# Patient Record
Sex: Female | Born: 1995 | Race: Black or African American | Hispanic: No | Marital: Single | Smoking: Never smoker
Health system: Southern US, Community
[De-identification: ages and names within clinical notes are randomized; demographics above are authoritative.]

## PROBLEM LIST (undated history)

## (undated) ENCOUNTER — Ambulatory Visit: Source: Home / Self Care

## (undated) ENCOUNTER — Inpatient Hospital Stay: Payer: Self-pay

## (undated) DIAGNOSIS — K219 Gastro-esophageal reflux disease without esophagitis: Secondary | ICD-10-CM

## (undated) DIAGNOSIS — D509 Iron deficiency anemia, unspecified: Secondary | ICD-10-CM

## (undated) DIAGNOSIS — E119 Type 2 diabetes mellitus without complications: Secondary | ICD-10-CM

## (undated) DIAGNOSIS — R519 Headache, unspecified: Secondary | ICD-10-CM

## (undated) DIAGNOSIS — R51 Headache: Secondary | ICD-10-CM

## (undated) DIAGNOSIS — J45909 Unspecified asthma, uncomplicated: Secondary | ICD-10-CM

---
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
## 2014-04-02 ENCOUNTER — Emergency Department: Payer: Self-pay | Admitting: Emergency Medicine

## 2014-05-23 ENCOUNTER — Emergency Department: Payer: Self-pay | Admitting: Emergency Medicine

## 2014-05-23 LAB — URINALYSIS, COMPLETE
BILIRUBIN, UR: NEGATIVE
Bacteria: NONE SEEN
Blood: NEGATIVE
Glucose,UR: NEGATIVE mg/dL (ref 0–75)
Ketone: NEGATIVE
LEUKOCYTE ESTERASE: NEGATIVE
NITRITE: NEGATIVE
Ph: 5 (ref 4.5–8.0)
Protein: NEGATIVE
RBC,UR: <1 /HPF (ref 0–5)
Specific Gravity: 1.027 (ref 1.003–1.030)
WBC UR: 2 /HPF (ref 0–5)

## 2014-05-23 LAB — CBC WITH DIFFERENTIAL/PLATELET
BASOS ABS: 0 10*3/uL (ref 0.0–0.1)
BASOS PCT: 0.3 %
EOS ABS: 0 10*3/uL (ref 0.0–0.7)
Eosinophil %: 0.1 %
HCT: 42 % (ref 35.0–47.0)
HGB: 13.8 g/dL (ref 12.0–16.0)
LYMPHS ABS: 0.9 10*3/uL — AB (ref 1.0–3.6)
Lymphocyte %: 9.8 %
MCH: 29.2 pg (ref 26.0–34.0)
MCHC: 32.7 g/dL (ref 32.0–36.0)
MCV: 89 fL (ref 80–100)
Monocyte #: 0.5 x10 3/mm (ref 0.2–0.9)
Monocyte %: 5.5 %
NEUTROS ABS: 7.4 10*3/uL — AB (ref 1.4–6.5)
NEUTROS PCT: 84.3 %
PLATELETS: 341 10*3/uL (ref 150–440)
RBC: 4.71 10*6/uL (ref 3.80–5.20)
RDW: 14.3 % (ref 11.5–14.5)
WBC: 8.8 10*3/uL (ref 3.6–11.0)

## 2014-05-23 LAB — COMPREHENSIVE METABOLIC PANEL
ALBUMIN: 3.9 g/dL (ref 3.8–5.6)
ALT: 26 U/L
Alkaline Phosphatase: 60 U/L
Anion Gap: 8 (ref 7–16)
BUN: 6 mg/dL — AB (ref 7–18)
Bilirubin,Total: 0.7 mg/dL (ref 0.2–1.0)
CHLORIDE: 105 mmol/L (ref 98–107)
CO2: 26 mmol/L (ref 21–32)
CREATININE: 0.78 mg/dL (ref 0.60–1.30)
Calcium, Total: 8.7 mg/dL — ABNORMAL LOW (ref 9.0–10.7)
EGFR (African American): >60
EGFR (Non-African Amer.): >60
GLUCOSE: 113 mg/dL — AB (ref 65–99)
Osmolality: 276 (ref 275–301)
Potassium: 3.3 mmol/L — ABNORMAL LOW (ref 3.5–5.1)
SGOT(AST): 21 U/L (ref 0–26)
Sodium: 139 mmol/L (ref 136–145)
Total Protein: 8.3 g/dL (ref 6.4–8.6)

## 2014-05-23 LAB — LIPASE, BLOOD: LIPASE: 124 U/L (ref 73–393)

## 2014-08-01 ENCOUNTER — Emergency Department: Payer: Self-pay | Admitting: Emergency Medicine

## 2015-01-04 ENCOUNTER — Encounter (HOSPITAL_COMMUNITY): Payer: Self-pay | Admitting: Emergency Medicine

## 2015-01-04 ENCOUNTER — Emergency Department (HOSPITAL_COMMUNITY)
Admission: EM | Admit: 2015-01-04 | Discharge: 2015-01-04 | Disposition: A | Discharge status: Home / Self Care | Payer: Medicaid Other | Attending: Emergency Medicine | Admitting: Emergency Medicine

## 2015-01-04 DIAGNOSIS — Y92481 Parking lot as the place of occurrence of the external cause: Secondary | ICD-10-CM | POA: Insufficient documentation

## 2015-01-04 DIAGNOSIS — Y9389 Activity, other specified: Secondary | ICD-10-CM | POA: Insufficient documentation

## 2015-01-04 DIAGNOSIS — S40022A Contusion of left upper arm, initial encounter: Secondary | ICD-10-CM

## 2015-01-04 DIAGNOSIS — Y998 Other external cause status: Secondary | ICD-10-CM | POA: Insufficient documentation
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
## 2015-01-04 NOTE — Discharge Instructions (Signed)
Contusion °A contusion is a deep bruise. Contusions are the result of an injury that caused bleeding under the skin. The contusion may turn blue, purple, or yellow. Minor injuries will give you a painless contusion, but more severe contusions may stay painful and swollen for a few weeks.  °CAUSES  °A contusion is usually caused by a blow, trauma, or direct force to an area of the body. °SYMPTOMS  °· Swelling and redness of the injured area. °· Bruising of the injured area. °· Tenderness and soreness of the injured area. °· Pain. °DIAGNOSIS  °The diagnosis can be made by taking a history and physical exam. An X-ray, CT scan, or MRI may be needed to determine if there were any associated injuries, such as fractures. °TREATMENT  °Specific treatment will depend on what area of the body was injured. In general, the best treatment for a contusion is resting, icing, elevating, and applying cold compresses to the injured area. Over-the-counter medicines may also be recommended for pain control. Ask your caregiver what the best treatment is for your contusion. °HOME CARE INSTRUCTIONS  °· Put ice on the injured area. °¨ Put ice in a plastic bag. °¨ Place a towel between your skin and the bag. °¨ Leave the ice on for 15-20 minutes, 3-4 times a day, or as directed by your health care provider. °· Only take over-the-counter or prescription medicines for pain, discomfort, or fever as directed by your caregiver. Your caregiver may recommend avoiding anti-inflammatory medicines (aspirin, ibuprofen, and naproxen) for 48 hours because these medicines may increase bruising. °· Rest the injured area. °· If possible, elevate the injured area to reduce swelling. °SEEK IMMEDIATE MEDICAL CARE IF:  °· You have increased bruising or swelling. °· You have pain that is getting worse. °· Your swelling or pain is not relieved with medicines. °MAKE SURE YOU:  °· Understand these instructions. °· Will watch your condition. °· Will get help right  away if you are not doing well or get worse. °Document Released: 02/18/2005 Document Revised: 05/16/2013 Document Reviewed: 03/16/2011 °[employer]® Patient Information ©2015 [employer]. This information is not intended to replace advice given to you by your health care provider. Make sure you discuss any questions you have with your health care provider. ° °
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
## 2015-03-13 ENCOUNTER — Emergency Department
Admission: EM | Admit: 2015-03-13 | Discharge: 2015-03-13 | Disposition: A | Discharge status: Home / Self Care | Payer: Medicaid Other | Attending: Emergency Medicine | Admitting: Emergency Medicine

## 2015-03-13 ENCOUNTER — Emergency Department: Payer: Medicaid Other

## 2015-03-13 ENCOUNTER — Encounter: Payer: Self-pay | Admitting: Emergency Medicine

## 2015-03-13 DIAGNOSIS — O200 Threatened abortion: Secondary | ICD-10-CM | POA: Insufficient documentation

## 2015-03-13 DIAGNOSIS — O24111 Pre-existing diabetes mellitus, type 2, in pregnancy, first trimester: Secondary | ICD-10-CM | POA: Diagnosis not present

## 2015-03-13 DIAGNOSIS — R1030 Lower abdominal pain, unspecified: Secondary | ICD-10-CM | POA: Insufficient documentation

## 2015-03-13 DIAGNOSIS — E119 Type 2 diabetes mellitus without complications: Secondary | ICD-10-CM | POA: Insufficient documentation

## 2015-03-13 DIAGNOSIS — Z3490 Encounter for supervision of normal pregnancy, unspecified, unspecified trimester: Secondary | ICD-10-CM

## 2015-03-13 DIAGNOSIS — Z3A01 Less than 8 weeks gestation of pregnancy: Secondary | ICD-10-CM | POA: Diagnosis not present

## 2015-03-13 DIAGNOSIS — O9989 Other specified diseases and conditions complicating pregnancy, childbirth and the puerperium: Secondary | ICD-10-CM | POA: Diagnosis not present

## 2015-03-13 HISTORY — DX: Type 2 diabetes mellitus without complications: E11.9

## 2015-03-13 LAB — BASIC METABOLIC PANEL
ANION GAP: 9 (ref 5–15)
BUN: 6 mg/dL (ref 6–20)
CHLORIDE: 108 mmol/L (ref 101–111)
CO2: 23 mmol/L (ref 22–32)
CREATININE: 0.49 mg/dL (ref 0.44–1.00)
Calcium: 9 mg/dL (ref 8.9–10.3)
GFR calc non Af Amer: >60 mL/min (ref >60)
Glucose, Bld: 89 mg/dL (ref 65–99)
POTASSIUM: 3.6 mmol/L (ref 3.5–5.1)
Sodium: 140 mmol/L (ref 135–145)

## 2015-03-13 LAB — URINALYSIS COMPLETE WITH MICROSCOPIC (ARMC ONLY)
BACTERIA UA: NONE SEEN
Bilirubin Urine: NEGATIVE
Glucose, UA: NEGATIVE mg/dL
HGB URINE DIPSTICK: NEGATIVE
LEUKOCYTES UA: NEGATIVE
NITRITE: NEGATIVE
PH: 5 (ref 5.0–8.0)
PROTEIN: NEGATIVE mg/dL
SPECIFIC GRAVITY, URINE: 1.024 (ref 1.005–1.030)

## 2015-03-13 LAB — CBC WITH DIFFERENTIAL/PLATELET
BASOS PCT: 0 %
Basophils Absolute: 0 10*3/uL (ref 0–0.1)
Eosinophils Absolute: 0 10*3/uL (ref 0–0.7)
Eosinophils Relative: 0 %
HEMATOCRIT: 31.6 % — AB (ref 35.0–47.0)
HEMOGLOBIN: 10.3 g/dL — AB (ref 12.0–16.0)
Lymphocytes Relative: 26 %
Lymphs Abs: 2.3 10*3/uL (ref 1.0–3.6)
MCH: 27 pg (ref 26.0–34.0)
MCHC: 32.5 g/dL (ref 32.0–36.0)
MCV: 83.1 fL (ref 80.0–100.0)
MONOS PCT: 7 %
Monocytes Absolute: 0.6 10*3/uL (ref 0.2–0.9)
NEUTROS ABS: 5.8 10*3/uL (ref 1.4–6.5)
NEUTROS PCT: 67 %
Platelets: 368 10*3/uL (ref 150–440)
RBC: 3.81 MIL/uL (ref 3.80–5.20)
RDW: 15.7 % — ABNORMAL HIGH (ref 11.5–14.5)
WBC: 8.7 10*3/uL (ref 3.6–11.0)

## 2015-03-13 LAB — HCG, QUANTITATIVE, PREGNANCY: hCG, Beta Chain, Quant, S: 7105 m[IU]/mL — ABNORMAL HIGH (ref <5)

## 2015-03-13 LAB — POCT PREGNANCY, URINE: PREG TEST UR: POSITIVE — AB

## 2015-03-13 NOTE — Discharge Instructions (Signed)
Your ultrasound shows a 5 week 4 day chest station. Follow with GYN for further care. They will need to redraw your pregnancy hormone level and compared to today's level. If you have worsening pain, heavy vaginal bleeding, or other urgent concerns, return to the emergency department.  Threatened Miscarriage A threatened miscarriage is when you have vaginal bleeding during your first 20 weeks of pregnancy but the pregnancy has not ended. Your doctor will do tests to make sure you are still pregnant. The cause of the bleeding may not be known. This condition does not mean your pregnancy will end. It does increase the risk of it ending (complete miscarriage). HOME CARE   Make sure you keep all your doctor visits for prenatal care.  Get plenty of rest.  Do not have sex or use tampons if you have vaginal bleeding.  Do not douche.  Do not smoke or use drugs.  Do not drink alcohol.  Avoid caffeine. GET HELP IF:  You have light bleeding from your vagina.  You have belly pain or cramping.  You have a fever. GET HELP RIGHT AWAY IF:   You have heavy bleeding from your vagina.  You have clots of blood coming from your vagina.  You have bad pain or cramps in your low back or belly.  You have fever, chills, and bad belly pain. MAKE SURE YOU:   Understand these instructions.  Will watch your condition.  Will get help right away if you are not doing well or get worse.   This information is not intended to replace advice given to you by your health care provider. Make sure you discuss any questions you have with your health care provider.   Document Released: 04/23/2008 Document Revised: 05/16/2013 Document Reviewed: 03/07/2013 [employer] Interactive Patient Education [employer]2016 [employer]

## 2015-03-13 NOTE — ED Provider Notes (Signed)
[facility] Emergency Department Provider Note  ____________________________________________  Time seen: 8:35 AM  I have reviewed the triage vital signs and the nursing notes.   HISTORY  Chief Complaint Abdominal Pain     HPI Katherine Downs is a 19 y.o. female who presents the emergency department with discomfort in her lower abdomen. She reports she is 8 days late for the onset of her menstrual cycle. She feels like she is having some cramping. She is not having any vaginal bleeding. She has mild nausea. There is no diarrhea.    Past Medical History  Diagnosis Date  . Diabetes mellitus without complication (HCC)     There are no active problems to display for this patient.   History reviewed. No pertinent past surgical history.  No current outpatient prescriptions on file.  Allergies Ibuprofen  No family history on file.  Social History Social History  Substance Use Topics  . Smoking status: Never Smoker   . Smokeless tobacco: None  . Alcohol Use: No    Review of Systems  Constitutional: Negative for fever. ENT: Negative for sore throat. Cardiovascular: Negative for chest pain. Respiratory: Negative for cough. Gastrointestinal: Lower abdominal discomfort and cramping. See history of present illness. Genitourinary: Lower abdominal discomfort and cramping. 8 days late for her period. See history of present illness Musculoskeletal: No myalgias or injuries. Skin: Negative for rash. Neurological: Negative for headaches   10-point ROS otherwise negative.  ____________________________________________   PHYSICAL EXAM:  VITAL SIGNS: ED Triage Vitals  Enc Vitals Group     BP 03/13/15 0816 111/75 mmHg     Pulse Rate 03/13/15 0816 101     Resp 03/13/15 0816 18     Temp 03/13/15 0816 98.7 F (37.1 C)     Temp Source 03/13/15 0816 Oral     SpO2 03/13/15 0816 100 %     Weight 03/13/15 0816 220 lb (99.791 kg)     Height 03/13/15  0816 5\' 3"  (1.6 m)     Head Cir --      Peak Flow --      Pain Score 03/13/15 0817 6     Pain Loc --      Pain Edu? --      Excl. in GC? --     Constitutional:  Alert and oriented. Well appearing and in no distress. ENT   Head: Normocephalic and atraumatic.   Nose: No congestion/rhinnorhea.    Cardiovascular: Normal rate, regular rhythm, no murmur noted Respiratory:  Normal respiratory effort, no tachypnea.    Breath sounds are clear and equal bilaterally.  Gastrointestinal: Soft. No distention. Minimal tenderness in lower abdomen. Back: No muscle spasm, no tenderness, no CVA tenderness. Musculoskeletal: No deformity noted. Nontender with normal range of motion in all extremities.  No noted edema. Neurologic:  Normal speech and language. No gross focal neurologic deficits are appreciated.  Skin:  Skin is warm, dry. No rash noted. Psychiatric: Mood and affect are normal. Speech and behavior are normal.  ____________________________________________    LABS (pertinent positives/negatives)  Labs Reviewed  CBC WITH DIFFERENTIAL/PLATELET - Abnormal; Notable for the following:    Hemoglobin 10.3 (*)    HCT 31.6 (*)    RDW 15.7 (*)    All other components within normal limits  URINALYSIS COMPLETEWITH MICROSCOPIC ([facility] ONLY) - Abnormal; Notable for the following:    Color, Urine YELLOW (*)    APPearance HAZY (*)    Ketones, ur 1+ (*)  Squamous Epithelial / LPF 6-30 (*)    All other components within normal limits  POCT PREGNANCY, URINE - Abnormal; Notable for the following:    Preg Test, Ur POSITIVE (*)    All other components within normal limits  BASIC METABOLIC PANEL  HCG, QUANTITATIVE, PREGNANCY  POC URINE PREG, ED     ____________________________________________  RADIOLOGY  US Ob. IMPRESSION: 1. Intrauterine gestational sac estimated at 5 weeks and 4 days gestation. A yolk sac is present. No embryonic pole. Recommend follow-up serial quantitative beta HCG  levels and repeat ultrasound in 10-14 days. 2. Corpus luteum cyst right ovary.  ____________________________________________   PROCEDURES   ____________________________________________   INITIAL IMPRESSION / ASSESSMENT AND PLAN / ED COURSE  Pertinent labs & imaging results that were available during my care of the patient were reviewed by me and considered in my medical decision making (see chart for details).  Well-appearing 39 oh female, 8 days late for her menstrual period. She has not done a pregnancy test at home and is wondering if she might be pregnant. We will get a pregnancy test, urinalysis, CBC and metabolic panel.  ----------------------------------------- 9:15 AM on 03/13/2015 -----------------------------------------  Pregnancy test is positive. We will add on hCG and an ultrasound.  ----------------------------------------- 1:12 PM on 03/13/2015 -----------------------------------------  Ultrasound shows an IUP at 5 weeks 4 days. HCG level is still pending. I've had called the lab about this value. They report that they're now running the test currently. The level of hCG today will not change her treatment plan. She will need to follow-up with GYN for repeat hCG testing in 2 days. I explained this to the patient and her mother. The GYN that repeats the test will be able to review the hCG level from today.  I will discharge the patient home with precautions for possible miscarriage.   ____________________________________________   FINAL CLINICAL IMPRESSION(S) / ED DIAGNOSES  Final diagnoses:  Early stage of pregnancy  Threatened miscarriage in early pregnancy      Darien Ramus, MD 03/13/15 1320

## 2015-03-13 NOTE — ED Notes (Signed)
Pt states abdominal pain/cramping started 4 days ago. Also states about 8 days late starting menstrual cycle. Denies n/v.

## 2015-03-13 NOTE — ED Notes (Signed)
Pt states lower abd pain began Sunday, states she could possibly be pregnant, she is 8 days late on cycle. Nausea only, no V/D.

## 2015-05-29 ENCOUNTER — Encounter: Payer: Self-pay | Admitting: *Deleted

## 2015-05-29 ENCOUNTER — Emergency Department
Admission: EM | Admit: 2015-05-29 | Discharge: 2015-05-29 | Disposition: A | Discharge status: Home / Self Care | Payer: Medicaid Other | Attending: Emergency Medicine | Admitting: Emergency Medicine

## 2015-05-29 DIAGNOSIS — O24112 Pre-existing diabetes mellitus, type 2, in pregnancy, second trimester: Secondary | ICD-10-CM | POA: Insufficient documentation

## 2015-05-29 DIAGNOSIS — Z3A16 16 weeks gestation of pregnancy: Secondary | ICD-10-CM | POA: Diagnosis not present

## 2015-05-29 DIAGNOSIS — Z3490 Encounter for supervision of normal pregnancy, unspecified, unspecified trimester: Secondary | ICD-10-CM

## 2015-05-29 DIAGNOSIS — O9989 Other specified diseases and conditions complicating pregnancy, childbirth and the puerperium: Secondary | ICD-10-CM | POA: Diagnosis present

## 2015-05-29 DIAGNOSIS — R1030 Lower abdominal pain, unspecified: Secondary | ICD-10-CM | POA: Diagnosis not present

## 2015-05-29 LAB — URINALYSIS COMPLETE WITH MICROSCOPIC (ARMC ONLY)
Bilirubin Urine: NEGATIVE
Glucose, UA: NEGATIVE mg/dL
Hgb urine dipstick: NEGATIVE
Ketones, ur: NEGATIVE mg/dL
Leukocytes, UA: NEGATIVE
Nitrite: NEGATIVE
PH: 6 (ref 5.0–8.0)
PROTEIN: NEGATIVE mg/dL
SPECIFIC GRAVITY, URINE: 1.023 (ref 1.005–1.030)

## 2015-05-29 LAB — COMPREHENSIVE METABOLIC PANEL
ALT: 13 U/L — ABNORMAL LOW (ref 14–54)
AST: 13 U/L — ABNORMAL LOW (ref 15–41)
Albumin: 3.6 g/dL (ref 3.5–5.0)
Alkaline Phosphatase: 50 U/L (ref 38–126)
Anion gap: 7 (ref 5–15)
BUN: 5 mg/dL — ABNORMAL LOW (ref 6–20)
CO2: 23 mmol/L (ref 22–32)
Calcium: 9.2 mg/dL (ref 8.9–10.3)
Chloride: 107 mmol/L (ref 101–111)
Creatinine, Ser: 0.35 mg/dL — ABNORMAL LOW (ref 0.44–1.00)
GFR calc Af Amer: >60 mL/min (ref >60)
GFR calc non Af Amer: >60 mL/min (ref >60)
Glucose, Bld: 102 mg/dL — ABNORMAL HIGH (ref 65–99)
Potassium: 3.3 mmol/L — ABNORMAL LOW (ref 3.5–5.1)
Sodium: 137 mmol/L (ref 135–145)
Total Bilirubin: 0.5 mg/dL (ref 0.3–1.2)
Total Protein: 7.4 g/dL (ref 6.5–8.1)

## 2015-05-29 LAB — CBC
HEMATOCRIT: 34.4 % — AB (ref 35.0–47.0)
HEMOGLOBIN: 11.7 g/dL — AB (ref 12.0–16.0)
MCH: 28.3 pg (ref 26.0–34.0)
MCHC: 34 g/dL (ref 32.0–36.0)
MCV: 83.2 fL (ref 80.0–100.0)
Platelets: 355 10*3/uL (ref 150–440)
RBC: 4.13 MIL/uL (ref 3.80–5.20)
RDW: 17.2 % — ABNORMAL HIGH (ref 11.5–14.5)
WBC: 10.5 10*3/uL (ref 3.6–11.0)

## 2015-05-29 LAB — HCG, QUANTITATIVE, PREGNANCY: hCG, Beta Chain, Quant, S: 23357 m[IU]/mL — ABNORMAL HIGH (ref <5)

## 2015-05-29 LAB — POCT PREGNANCY, URINE: PREG TEST UR: POSITIVE — AB

## 2015-05-29 NOTE — ED Notes (Signed)
Fetal heart tones approx 142 with doppler. Hard to count due to location.

## 2015-05-29 NOTE — ED Notes (Signed)
Pt states lower abd cramping, states she is 16 weeks pregnet, denies any bleeding

## 2015-05-29 NOTE — Discharge Instructions (Signed)
You have appropriate fetal heart tones today. He had an ultrasound previously that showed an IUP. Please call your obstetricians at [facility] for follow-up for this discomfort you been having. Return to the emergency department if he began to have bleeding, worsening pain, or other urgent concerns.

## 2015-05-29 NOTE — ED Provider Notes (Signed)
[facility] Emergency Department Provider Note  ____________________________________________  Time seen: 1920  I have reviewed the triage vital signs and the nursing notes.  History by:  Patient  HISTORY  Chief Complaint Abdominal Pain  [redacted] weeks pregnant    HPI Katherine Downs is a 20 y.o. female who reports that she has been having lower abdominal discomfort for the past 3 days.She is [redacted] weeks pregnant. [facility]. She had an ultrasound in November which confirmed an IUP at that time. She denies any vaginal bleeding. She reports this discomfort is diffuse and equal across the lower abdomen and is bothering her at night and not letting her sleep well. She denies any nausea or vomiting.     Past Medical History  Diagnosis Date  . Diabetes mellitus without complication (HCC)     There are no active problems to display for this patient.   History reviewed. No pertinent past surgical history.  No current outpatient prescriptions on file.  Allergies Ibuprofen  History reviewed. No pertinent family history.  Social History Social History  Substance Use Topics  . Smoking status: Never Smoker   . Smokeless tobacco: None  . Alcohol Use: No    Review of Systems  Constitutional: Negative for fever/chills. ENT: Negative for congestion. Cardiovascular: Negative for chest pain. Respiratory: Negative for cough. Gastrointestinal: Negative for vomiting and diarrhea. Genitourinary: Pregnant, lower abdominal pain, see history of present illness Musculoskeletal: No back pain. Skin: Negative for rash. Neurological: Negative for headache or focal weakness   10-point ROS otherwise negative.  ____________________________________________   PHYSICAL EXAM:  VITAL SIGNS: ED Triage Vitals  Enc Vitals Group     BP 05/29/15 1653 149/86 mmHg     Pulse Rate 05/29/15 1653 91     Resp 05/29/15 1653 18     Temp 05/29/15 1653 98.3  F (36.8 C)     Temp Source 05/29/15 1653 Oral     SpO2 05/29/15 1653 99 %     Weight 05/29/15 1653 220 lb (99.791 kg)     Height 05/29/15 1653 5\' 4"  (1.626 m)     Head Cir --      Peak Flow --      Pain Score 05/29/15 1654 8     Pain Loc --      Pain Edu? --      Excl. in GC? --     Constitutional: Alert and oriented. Well appearing and in no distress. ENT   Head: Normocephalic and atraumatic. Cardiovascular: Normal rate, regular rhythm, no murmur noted Respiratory:  Normal respiratory effort, no tachypnea.    Breath sounds are clear and equal bilaterally.  Gastrointestinal: Soft, no distention. Mild tenderness throughout the lower abdomen. Back: No muscle spasm, no tenderness, no CVA tenderness. Musculoskeletal: No deformity noted. Nontender with normal range of motion in all extremities.  No noted edema. Neurologic:  Communicative. Normal appearing spontaneous movement in all 4 extremities. No gross focal neurologic deficits are appreciated.  Skin:  Skin is warm, dry. No rash noted. Psychiatric: Mood and affect are normal. Speech and behavior are normal.  ____________________________________________    LABS (pertinent positives/negatives)  Labs Reviewed  COMPREHENSIVE METABOLIC PANEL - Abnormal; Notable for the following:    Potassium 3.3 (*)    Glucose, Bld 102 (*)    BUN 5 (*)    Creatinine, Ser 0.35 (*)    AST 13 (*)    ALT 13 (*)    All  other components within normal limits  CBC - Abnormal; Notable for the following:    Hemoglobin 11.7 (*)    HCT 34.4 (*)    RDW 17.2 (*)    All other components within normal limits  URINALYSIS COMPLETEWITH MICROSCOPIC ([facility] ONLY) - Abnormal; Notable for the following:    Color, Urine YELLOW (*)    APPearance CLEAR (*)    Bacteria, UA FEW (*)    Squamous Epithelial / LPF 6-30 (*)    All other components within normal limits  POCT PREGNANCY, URINE - Abnormal; Notable for the following:    Preg Test, Ur POSITIVE (*)    All  other components within normal limits  HCG, QUANTITATIVE, PREGNANCY     ____________________________________________   EKG    ____________________________________________    RADIOLOGY    ____________________________________________   PROCEDURES    ____________________________________________   INITIAL IMPRESSION / ASSESSMENT AND PLAN / ED COURSE  Pertinent labs & imaging results that were available during my care of the patient were reviewed by me and considered in my medical decision making (see chart for details).  Well-appearing 35 oh female in no acute distress. She is [redacted] weeks pregnant and has lower abdominal pain. She has mild diffuse tenderness throughout the lower abdomen. She denies any bleeding or discharge. She does have positive fetal heart tones documented by the nursing staff. She has a prior ultrasound that shows an IUP. There is no indication that this is a miscarriage. I discussed how this could be discomfort of pregnancy. I've asked her to follow-up with her obstetrician's at [facility].  ____________________________________________   FINAL CLINICAL IMPRESSION(S) / ED DIAGNOSES  Final diagnoses:  Pregnancy  Lower abdominal pain   discomfort of pregnancy    Katherine Ramus, MD 05/29/15 1935

## 2015-07-26 DIAGNOSIS — IMO0001 Reserved for inherently not codable concepts without codable children: Secondary | ICD-10-CM | POA: Insufficient documentation

## 2015-07-26 DIAGNOSIS — R030 Elevated blood-pressure reading, without diagnosis of hypertension: Secondary | ICD-10-CM

## 2015-08-04 ENCOUNTER — Emergency Department: Payer: Medicaid Other

## 2015-08-04 ENCOUNTER — Encounter: Payer: Self-pay | Admitting: Radiology

## 2015-08-04 ENCOUNTER — Emergency Department
Admission: EM | Admit: 2015-08-04 | Discharge: 2015-08-04 | Disposition: A | Discharge status: Home / Self Care | Payer: Medicaid Other | Attending: Emergency Medicine | Admitting: Emergency Medicine

## 2015-08-04 DIAGNOSIS — O26892 Other specified pregnancy related conditions, second trimester: Secondary | ICD-10-CM | POA: Diagnosis present

## 2015-08-04 DIAGNOSIS — E876 Hypokalemia: Secondary | ICD-10-CM | POA: Diagnosis not present

## 2015-08-04 DIAGNOSIS — Z3A26 26 weeks gestation of pregnancy: Secondary | ICD-10-CM | POA: Insufficient documentation

## 2015-08-04 DIAGNOSIS — B349 Viral infection, unspecified: Secondary | ICD-10-CM | POA: Insufficient documentation

## 2015-08-04 DIAGNOSIS — E119 Type 2 diabetes mellitus without complications: Secondary | ICD-10-CM | POA: Diagnosis not present

## 2015-08-04 LAB — URINALYSIS COMPLETE WITH MICROSCOPIC (ARMC ONLY)
Bilirubin Urine: NEGATIVE
GLUCOSE, UA: NEGATIVE mg/dL
HGB URINE DIPSTICK: NEGATIVE
Ketones, ur: NEGATIVE mg/dL
Leukocytes, UA: NEGATIVE
NITRITE: NEGATIVE
PROTEIN: 30 mg/dL — AB
SPECIFIC GRAVITY, URINE: 1.028 (ref 1.005–1.030)
pH: 6 (ref 5.0–8.0)

## 2015-08-04 LAB — CBC
HEMATOCRIT: 31.9 % — AB (ref 35.0–47.0)
HEMOGLOBIN: 10.8 g/dL — AB (ref 12.0–16.0)
MCH: 29.1 pg (ref 26.0–34.0)
MCHC: 33.8 g/dL (ref 32.0–36.0)
MCV: 86.2 fL (ref 80.0–100.0)
Platelets: 382 10*3/uL (ref 150–440)
RBC: 3.7 MIL/uL — ABNORMAL LOW (ref 3.80–5.20)
RDW: 15 % — AB (ref 11.5–14.5)
WBC: 10.3 10*3/uL (ref 3.6–11.0)

## 2015-08-04 LAB — BASIC METABOLIC PANEL
ANION GAP: 8 (ref 5–15)
CHLORIDE: 106 mmol/L (ref 101–111)
CO2: 20 mmol/L — AB (ref 22–32)
Calcium: 8.4 mg/dL — ABNORMAL LOW (ref 8.9–10.3)
Creatinine, Ser: 0.38 mg/dL — ABNORMAL LOW (ref 0.44–1.00)
GFR calc Af Amer: >60 mL/min (ref >60)
GLUCOSE: 91 mg/dL (ref 65–99)
POTASSIUM: 2.8 mmol/L — AB (ref 3.5–5.1)
Sodium: 134 mmol/L — ABNORMAL LOW (ref 135–145)

## 2015-08-04 LAB — INFLUENZA PANEL BY PCR (TYPE A & B)
H1N1 flu by pcr: NOT DETECTED
INFLBPCR: NEGATIVE
Influenza A By PCR: NEGATIVE

## 2015-08-04 LAB — RAPID INFLUENZA A&B ANTIGENS (ARMC ONLY)
INFLUENZA A (ARMC): NEGATIVE
INFLUENZA B (ARMC): NEGATIVE

## 2015-08-04 MED ORDER — ACETAMINOPHEN 325 MG PO TABS
650.0000 mg | ORAL_TABLET | Freq: Once | ORAL | Status: AC
  Administered 2015-08-04: 650 mg via ORAL

## 2015-08-04 MED ORDER — ACETAMINOPHEN 325 MG PO TABS
ORAL_TABLET | ORAL | Status: DC
Start: 2015-08-04 — End: 2015-08-05
  Filled 2015-08-04: qty 2

## 2015-08-04 MED ORDER — POTASSIUM CHLORIDE CRYS ER 20 MEQ PO TBCR
40.0000 meq | EXTENDED_RELEASE_TABLET | Freq: Once | ORAL | Status: AC
  Administered 2015-08-04: 40 meq via ORAL
  Filled 2015-08-04: qty 2

## 2015-08-04 MED ORDER — SODIUM CHLORIDE 0.9 % IV BOLUS (SEPSIS)
1000.0000 mL | Freq: Once | INTRAVENOUS | Status: AC
  Administered 2015-08-04: 1000 mL via INTRAVENOUS

## 2015-08-04 MED ORDER — POTASSIUM CHLORIDE ER 10 MEQ PO TBCR: 20.0000 meq | EXTENDED_RELEASE_TABLET | Freq: Every day | ORAL | Status: DC

## 2015-08-04 NOTE — ED Notes (Addendum)
Pt states that she woke up this am with headache, hot, sweating, painful cough, feeling weak and dizzy at times, reports some diarrhea. Took a tylenol at 1000. Pt reports that she is 6mos pregnant

## 2015-08-04 NOTE — Discharge Instructions (Signed)
You have been seen in the Emergency Department (ED) today for a likely viral illness.  Please drink plenty of clear fluids (water, Gatorade, chicken broth, etc).  You may use Tylenol according to label instructions.  Call [facility] in the morning for a follow-up appointment within the next 1 to 2 days.  Please follow up with your doctor as listed above.  Call your doctor or return to the Emergency Department (ED) if you are unable to tolerate fluids due to vomiting, have worsening trouble breathing, have ANY abdominal pain or vaginal bleeding or discharge, become extremely tired or difficult to awaken, or if you develop any other symptoms that concern you.   Viral Infections A viral infection can be caused by different types of viruses.Most viral infections are not serious and resolve on their own. However, some infections may cause severe symptoms and may lead to further complications. SYMPTOMS Viruses can frequently cause:  Minor sore throat.  Aches and pains.  Headaches.  Runny nose.  Different types of rashes.  Watery eyes.  Tiredness.  Cough.  Loss of appetite.  Gastrointestinal infections, resulting in nausea, vomiting, and diarrhea. These symptoms do not respond to antibiotics because the infection is not caused by bacteria. However, you might catch a bacterial infection following the viral infection. This is sometimes called a "superinfection." Symptoms of such a bacterial infection may include:  Worsening sore throat with pus and difficulty swallowing.  Swollen neck glands.  Chills and a high or persistent fever.  Severe headache.  Tenderness over the sinuses.  Persistent overall ill feeling (malaise), muscle aches, and tiredness (fatigue).  Persistent cough.  Yellow, green, or brown mucus production with coughing. HOME CARE INSTRUCTIONS   Only take over-the-counter or prescription medicines for pain, discomfort, diarrhea, or fever as directed by  your caregiver.  Drink enough water and fluids to keep your urine clear or pale yellow. Sports drinks can provide valuable electrolytes, sugars, and hydration.  Get plenty of rest and maintain proper nutrition. Soups and broths with crackers or rice are fine. SEEK IMMEDIATE MEDICAL CARE IF:   You have severe headaches, shortness of breath, chest pain, neck pain, or an unusual rash.  You have uncontrolled vomiting, diarrhea, or you are unable to keep down fluids.  You or your child has an oral temperature above 102 F (38.9 C), not controlled by medicine.  Your baby is older than 3 months with a rectal temperature of 102 F (38.9 C) or higher.  Your baby is 443 months old or younger with a rectal temperature of 100.4 F (38 C) or higher. MAKE SURE YOU:   Understand these instructions.  Will watch your condition.  Will get help right away if you are not doing well or get worse.   This information is not intended to replace advice given to you by your health care provider. Make sure you discuss any questions you have with your health care provider.   Document Released: 02/18/2005 Document Revised: 08/03/2011 Document Reviewed: 10/17/2014 Elsevier Interactive Patient Education [employer]2016 [employer]vier Inc.

## 2015-08-04 NOTE — ED Notes (Signed)
Lab called with potassium of 2.8   Dr Fanny Bienquale aware.

## 2015-08-04 NOTE — ED Notes (Signed)
Fetal HR 150

## 2015-08-05 NOTE — ED Provider Notes (Signed)
[facility] Emergency Department Provider Note  ____________________________________________  Time seen: Approximately 20:00   I have reviewed the triage vital signs and the nursing notes.   HISTORY  Chief Complaint Weakness    HPI Katherine Downs is a 20 y.o. female reports that she is approximately [redacted] weeks pregnant. She states that yesterday she began feeling a little chilled and achy. This 40 she woke up with a mild headache, feeling hot and sweaty, she's having a cough and a dry throat. She reports achy muscles and she's had approximately 4 loose watery nonbloody stools today.  She reports she continues to feel the baby move normally. She is not having abdominal pain vaginal discharge or loss of fluid. She does report a dry nonproductive cough. She is able to swallow but just feels a scratchy and somewhat painful sensation in the back of her throat swallowing.   Past Medical History  Diagnosis Date  . Diabetes mellitus without complication (HCC)     There are no active problems to display for this patient.   No past surgical history on file.  Current Outpatient Rx  Name  Route  Sig  Dispense  Refill  . potassium chloride (K-DUR) 10 MEQ tablet   Oral   Take 2 tablets (20 mEq total) by mouth daily.   8 tablet   0     Allergies Ibuprofen  No family history on file.  Social History Social History  Substance Use Topics  . Smoking status: Never Smoker   . Smokeless tobacco: Not on file  . Alcohol Use: No    Review of Systems Constitutional: Low-grade fever and some chills and muscle aches. Feels fatigued. Eyes: No visual changes. ENT: Slight sore scratchy throat. No trouble swallowing. Cardiovascular: Denies chest pain. Respiratory: Denies shortness of breath. Does report a dry nonproductive cough. Gastrointestinal: No abdominal pain.  No nausea, no vomiting.  Few loose stools today. Nonbloody. Denies recent travel, no recent  antibiotics. Genitourinary: Negative for dysuria. Musculoskeletal: Negative for back pain. Skin: Negative for rash. Neurological: Negative for focal weakness or numbness. No neck pain or stiffness. No confusion.  10-point ROS otherwise negative.  ____________________________________________   PHYSICAL EXAM:  VITAL SIGNS: ED Triage Vitals  Enc Vitals Group     BP 08/04/15 1851 122/73 mmHg     Pulse Rate 08/04/15 1851 120     Resp 08/04/15 1851 18     Temp 08/04/15 1851 100.1 F (37.8 C)     Temp Source 08/04/15 1851 Oral     SpO2 08/04/15 1851 98 %     Weight 08/04/15 1851 231 lb (104.781 kg)     Height 08/04/15 1851  (1.626 m)     Head Cir --      Peak Flow --      Pain Score 08/04/15 2026 6     Pain Loc --      Pain Edu? --      Excl. in GC? --    Constitutional: Alert and oriented. Well appearing and in no acute distress.Very pleasant. Sitting upright watching television Eyes: Conjunctivae are normal. PERRL. EOMI. Head: Atraumatic. Nose: No congestion/rhinnorhea. No tonsillar hypertrophy or exudates. Mild injection of the posterior pharynx with no lesion or mass. No anterior tender cervical adenopathy. No meningismus, and negative for jolt accentuation of her mild headache. Mouth/Throat: Mucous membranes are moist.  Neck: No stridor.   Cardiovascular: Slightly tachycardic rate, regular rhythm. Grossly normal heart sounds.  Good peripheral circulation.  Respiratory: Normal respiratory effort.  No retractions. Lungs CTAB. Occasional dry cough. No wheezing. Speaks in full and complete sentences. Gastrointestinal: Soft and nontender. No distention. Gravid. Musculoskeletal: No lower extremity tenderness nor edema.  No joint effusions. Neurologic:  Normal speech and language. No gross focal neurologic deficits are appreciated. No photophobia. No gait instability. Skin:  Skin is warm, dry and intact. No rash noted. Psychiatric: Mood and affect are normal. Speech and behavior  are normal.  ____________________________________________   LABS (all labs ordered are listed, but only abnormal results are displayed)  Labs Reviewed  BASIC METABOLIC PANEL - Abnormal; Notable for the following:    Sodium 134 (*)    Potassium 2.8 (*)    CO2 20 (*)    BUN <5 (*)    Creatinine, Ser 0.38 (*)    Calcium 8.4 (*)    All other components within normal limits  CBC - Abnormal; Notable for the following:    RBC 3.70 (*)    Hemoglobin 10.8 (*)    HCT 31.9 (*)    RDW 15.0 (*)    All other components within normal limits  URINALYSIS COMPLETEWITH MICROSCOPIC ([facility] ONLY) - Abnormal; Notable for the following:    Color, Urine AMBER (*)    APPearance CLEAR (*)    Protein, ur 30 (*)    Bacteria, UA RARE (*)    Squamous Epithelial / LPF 0-5 (*)    All other components within normal limits  RAPID INFLUENZA A&B ANTIGENS ([facility] ONLY)  INFLUENZA PANEL BY PCR (TYPE A & B, H1N1)   ____________________________________________  EKG  Reviewed and interpreted by me at 2050 Sinus tachycardia at a rate of 105 QRS normal QTC normal No evidence of acute ischemic abnormality. Reviewed and interpreted as sinus tachycardia ____________________________________________  RADIOLOGY  DG Chest 2 View (Final result) Result time: 08/04/15 16:10:96   Final result by Rad Results In Interface (08/04/15 04:54:09)   Narrative:   CLINICAL DATA: Acute onset of cough, generalized chest pain and weakness. Syncope. Initial encounter.  EXAM: CHEST 2 VIEW  COMPARISON: Chest radiograph performed 01/21/2015  FINDINGS: The lungs are well-aerated and clear. There is no evidence of focal opacification, pleural effusion or pneumothorax.  The heart is normal in size; the mediastinal contour is within normal limits. No acute osseous abnormalities are seen.  IMPRESSION: No acute cardiopulmonary process seen.   Electronically Signed By: Roanna Raider M.D. On: 08/04/2015 21:26     ____________________________________________   PROCEDURES  Procedure(s) performed: None  Critical Care performed: No  ____________________________________________   INITIAL IMPRESSION / ASSESSMENT AND PLAN / ED COURSE  Pertinent labs & imaging results that were available during my care of the patient were reviewed by me and considered in my medical decision making (see chart for details).  Patient presents for fever, mild headache, cough and sore throat. Her influenza test including rapid and more sensitive PCR are both negative. She does not have any photophobia, meningismus, negative jolt accentuation confusion or clinical examination/history consistent with any concern for meningitis. Her chest x-ray is clear, her labs are reassuring except her potassium is slightly low. In the setting of recent loose stools, I suspect is likely due to GI loss. Her EKG is reassuring. Overall her clinical examination appears to be most consistent with viral illness. No evidence of acute bacterial illness by exam. Centor score = 1, low risk and no evidence to suggest the need for testing for acute strep at this time, her clinical history and exam  do not seem to suggest risk for strep infection.  Denies any gynecologic/obstetric complaint. Fetal heart tones 150. Patient awake alert no distress. She does feel much better after hydration. No ketones in the urine.  Case and care discussed with Dr. Arvella MerlesSchemerhorn, of on-call gynecologist. He advises patient would agree patient appropriate for discharge with potassium repletion prescription. I believe that she is nontoxic in no distress and likely viral illness.  Discussed with the patient, and her mother and gave strict return precautions as well as prescription for potassium. [facility] tomorrow to arrange close follow-up.  Return precautions and treatment recommendations and follow-up discussed with the patient who  is agreeable with the plan.  ____________________________________________   FINAL CLINICAL IMPRESSION(S) / ED DIAGNOSES  Final diagnoses:  Viral syndrome  Hypokalemia      Sharyn CreamerMark Jesstin Studstill, MD 08/05/15 0013

## 2015-09-02 DIAGNOSIS — R4586 Emotional lability: Secondary | ICD-10-CM | POA: Insufficient documentation

## 2015-09-10 DIAGNOSIS — O99810 Abnormal glucose complicating pregnancy: Secondary | ICD-10-CM | POA: Insufficient documentation

## 2015-09-16 ENCOUNTER — Observation Stay
Admission: EM | Admit: 2015-09-16 | Discharge: 2015-09-17 | Disposition: A | Discharge status: Home / Self Care | Payer: No Typology Code available for payment source | Attending: Obstetrics and Gynecology | Admitting: Obstetrics and Gynecology

## 2015-09-16 ENCOUNTER — Encounter: Payer: Self-pay | Admitting: Emergency Medicine

## 2015-09-16 DIAGNOSIS — Z6740 Type O blood, Rh positive: Secondary | ICD-10-CM | POA: Diagnosis not present

## 2015-09-16 DIAGNOSIS — O4703 False labor before 37 completed weeks of gestation, third trimester: Secondary | ICD-10-CM | POA: Insufficient documentation

## 2015-09-16 DIAGNOSIS — Z3A32 32 weeks gestation of pregnancy: Secondary | ICD-10-CM | POA: Diagnosis not present

## 2015-09-16 DIAGNOSIS — Y9389 Activity, other specified: Secondary | ICD-10-CM | POA: Diagnosis not present

## 2015-09-16 DIAGNOSIS — Y998 Other external cause status: Secondary | ICD-10-CM | POA: Diagnosis not present

## 2015-09-16 DIAGNOSIS — Y92410 Unspecified street and highway as the place of occurrence of the external cause: Secondary | ICD-10-CM | POA: Diagnosis not present

## 2015-09-16 DIAGNOSIS — O9989 Other specified diseases and conditions complicating pregnancy, childbirth and the puerperium: Secondary | ICD-10-CM | POA: Diagnosis present

## 2015-09-16 DIAGNOSIS — Z886 Allergy status to analgesic agent status: Secondary | ICD-10-CM | POA: Insufficient documentation

## 2015-09-16 DIAGNOSIS — S3981XA Other specified injuries of abdomen, initial encounter: Secondary | ICD-10-CM | POA: Insufficient documentation

## 2015-09-16 DIAGNOSIS — R109 Unspecified abdominal pain: Secondary | ICD-10-CM

## 2015-09-16 DIAGNOSIS — S3991XA Unspecified injury of abdomen, initial encounter: Secondary | ICD-10-CM

## 2015-09-16 DIAGNOSIS — O9A213 Injury, poisoning and certain other consequences of external causes complicating pregnancy, third trimester: Secondary | ICD-10-CM

## 2015-09-16 LAB — CBC WITH DIFFERENTIAL/PLATELET
Basophils Absolute: 0 10*3/uL (ref 0–0.1)
Basophils Relative: 0 %
EOS ABS: 0 10*3/uL (ref 0–0.7)
Eosinophils Relative: 0 %
HCT: 26.7 % — ABNORMAL LOW (ref 35.0–47.0)
HEMOGLOBIN: 8.9 g/dL — AB (ref 12.0–16.0)
LYMPHS ABS: 2.8 10*3/uL (ref 1.0–3.6)
Lymphocytes Relative: 21 %
MCH: 27.1 pg (ref 26.0–34.0)
MCHC: 33.4 g/dL (ref 32.0–36.0)
MCV: 81.2 fL (ref 80.0–100.0)
Monocytes Absolute: 0.8 10*3/uL (ref 0.2–0.9)
Monocytes Relative: 6 %
NEUTROS PCT: 73 %
Neutro Abs: 9.4 10*3/uL — ABNORMAL HIGH (ref 1.4–6.5)
Platelets: 394 10*3/uL (ref 150–440)
RBC: 3.28 MIL/uL — AB (ref 3.80–5.20)
RDW: 14.7 % — ABNORMAL HIGH (ref 11.5–14.5)
WBC: 13.1 10*3/uL — AB (ref 3.6–11.0)

## 2015-09-16 MED ORDER — ACETAMINOPHEN 325 MG PO TABS
650.0000 mg | ORAL_TABLET | Freq: Once | ORAL | Status: AC
  Administered 2015-09-16: 650 mg via ORAL

## 2015-09-16 MED ORDER — CYCLOBENZAPRINE HCL 10 MG PO TABS: 5.0000 mg | ORAL_TABLET | Freq: Three times a day (TID) | ORAL | Status: DC | PRN

## 2015-09-16 MED ORDER — ACETAMINOPHEN 325 MG PO TABS
ORAL_TABLET | ORAL | Status: AC
  Filled 2015-09-16: qty 2
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
## 2015-09-16 NOTE — ED Notes (Signed)
Unable to doppler fetal heart tones, MD aware and will use ultrasound.
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
## 2015-09-17 ENCOUNTER — Observation Stay: Payer: No Typology Code available for payment source

## 2015-09-17 LAB — TYPE AND SCREEN
ABO/RH(D): O POS
ANTIBODY SCREEN: NEGATIVE

## 2015-09-17 LAB — KLEIHAUER-BETKE STAIN
Fetal Cells %: 0 %
QUANTITATION FETAL HEMOGLOBIN: 0 mL

## 2015-09-17 LAB — ABO/RH: ABO/RH(D): O POS

## 2015-09-17 MED ORDER — CYCLOBENZAPRINE HCL 5 MG PO TABS: 5.0000 mg | ORAL_TABLET | Freq: Two times a day (BID) | ORAL | Status: DC | PRN
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
## 2015-09-17 NOTE — Progress Notes (Signed)
Discharge instructions reviewed with patient including need to schedule 1 week follow up with OB/GYN and when to seek medical attention. All questions answered. Patient discharged home, ambulatory with steady gait, escorted by family member. No signs of distress observed at time of discharge.
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
## 2015-10-01 ENCOUNTER — Inpatient Hospital Stay
Admission: EM | Admit: 2015-10-01 | Discharge: 2015-10-01 | Disposition: A | Discharge status: Home / Self Care | Payer: Medicaid Other | Attending: Obstetrics and Gynecology | Admitting: Obstetrics and Gynecology

## 2015-10-01 DIAGNOSIS — Z3A34 34 weeks gestation of pregnancy: Secondary | ICD-10-CM | POA: Diagnosis not present

## 2015-10-01 LAB — URINALYSIS COMPLETE WITH MICROSCOPIC (ARMC ONLY)
Bacteria, UA: NONE SEEN
Bilirubin Urine: NEGATIVE
Glucose, UA: NEGATIVE mg/dL
Hgb urine dipstick: NEGATIVE
NITRITE: NEGATIVE
PH: 5 (ref 5.0–8.0)
PROTEIN: 30 mg/dL — AB
Specific Gravity, Urine: 1.023 (ref 1.005–1.030)

## 2015-10-01 MED ORDER — ZOLPIDEM TARTRATE 5 MG PO TABS: 5.0000 mg | ORAL_TABLET | Freq: Every evening | ORAL | Status: DC | PRN

## 2015-10-01 MED ORDER — DOCUSATE SODIUM 100 MG PO CAPS: 100.0000 mg | ORAL_CAPSULE | Freq: Every day | ORAL | Status: DC

## 2015-10-01 MED ORDER — ACETAMINOPHEN 325 MG PO TABS: 650.0000 mg | ORAL_TABLET | ORAL | Status: DC | PRN

## 2015-10-01 MED ORDER — PRENATAL MULTIVITAMIN CH: 1.0000 | ORAL_TABLET | Freq: Every day | ORAL | Status: DC

## 2015-10-01 MED ORDER — CALCIUM CARBONATE ANTACID 500 MG PO CHEW: 2.0000 | CHEWABLE_TABLET | ORAL | Status: DC | PRN

## 2015-10-01 NOTE — Final Progress Note (Signed)
Discharge instructions reviewed with patient including follow up appointment, signs of preterm labor, and when to seek medical attention. All questions answered. Patient verbalized understanding. Patient ambulatory with steady gait, escorted by family member, no signs of distress observed at time of discharge.
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
## 2015-10-01 NOTE — OB Triage Note (Signed)
Patient presented by EMS complaining of contractions that started around 2am, denies vaginal bleeding, leaking of fluid, or decreased fetal movement. States most recent intercourse was yesterday.

## 2015-11-15 DIAGNOSIS — D62 Acute posthemorrhagic anemia: Secondary | ICD-10-CM | POA: Insufficient documentation

## 2016-01-10 ENCOUNTER — Emergency Department
Admission: EM | Admit: 2016-01-10 | Discharge: 2016-01-10 | Disposition: A | Discharge status: Home / Self Care | Payer: Medicaid Other | Attending: Emergency Medicine | Admitting: Emergency Medicine

## 2016-01-10 ENCOUNTER — Emergency Department: Payer: Medicaid Other

## 2016-01-10 ENCOUNTER — Encounter: Payer: Self-pay | Admitting: Emergency Medicine

## 2016-01-10 DIAGNOSIS — R0789 Other chest pain: Secondary | ICD-10-CM | POA: Diagnosis present

## 2016-01-10 DIAGNOSIS — D649 Anemia, unspecified: Secondary | ICD-10-CM | POA: Insufficient documentation

## 2016-01-10 DIAGNOSIS — Z79899 Other long term (current) drug therapy: Secondary | ICD-10-CM | POA: Diagnosis not present

## 2016-01-10 DIAGNOSIS — R079 Chest pain, unspecified: Secondary | ICD-10-CM

## 2016-01-10 LAB — BASIC METABOLIC PANEL
ANION GAP: 2 — AB (ref 5–15)
BUN: 11 mg/dL (ref 6–20)
CALCIUM: 9 mg/dL (ref 8.9–10.3)
CO2: 25 mmol/L (ref 22–32)
Chloride: 111 mmol/L (ref 101–111)
Creatinine, Ser: 0.67 mg/dL (ref 0.44–1.00)
GFR calc Af Amer: >60 mL/min (ref >60)
GFR calc non Af Amer: >60 mL/min (ref >60)
GLUCOSE: 89 mg/dL (ref 65–99)
Potassium: 3.4 mmol/L — ABNORMAL LOW (ref 3.5–5.1)
Sodium: 138 mmol/L (ref 135–145)

## 2016-01-10 LAB — CBC WITH DIFFERENTIAL/PLATELET
BASOS ABS: 0 10*3/uL (ref 0–0.1)
Basophils Relative: 1 %
EOS PCT: 1 %
Eosinophils Absolute: 0.1 10*3/uL (ref 0–0.7)
HEMATOCRIT: 27 % — AB (ref 35.0–47.0)
Hemoglobin: 8.4 g/dL — ABNORMAL LOW (ref 12.0–16.0)
LYMPHS ABS: 2.7 10*3/uL (ref 1.0–3.6)
LYMPHS PCT: 33 %
MCH: 22 pg — AB (ref 26.0–34.0)
MCHC: 30.9 g/dL — ABNORMAL LOW (ref 32.0–36.0)
MCV: 71.1 fL — AB (ref 80.0–100.0)
MONO ABS: 0.4 10*3/uL (ref 0.2–0.9)
MONOS PCT: 5 %
NEUTROS ABS: 5 10*3/uL (ref 1.4–6.5)
Neutrophils Relative %: 60 %
Platelets: 475 10*3/uL — ABNORMAL HIGH (ref 150–440)
RBC: 3.8 MIL/uL (ref 3.80–5.20)
RDW: 18.4 % — AB (ref 11.5–14.5)
WBC: 8.2 10*3/uL (ref 3.6–11.0)

## 2016-01-10 LAB — FIBRIN DERIVATIVES D-DIMER (ARMC ONLY): Fibrin derivatives D-dimer (ARMC): 441 (ref 0–499)

## 2016-01-10 LAB — TROPONIN I: Troponin I: <0.03 ng/mL (ref <0.03)

## 2016-01-10 MED ORDER — FERROUS SULFATE 325 (65 FE) MG PO TABS: 325.0000 mg | ORAL_TABLET | Freq: Every day | ORAL | 3 refills | Status: DC

## 2016-01-10 NOTE — ED Triage Notes (Signed)
C/O mid chest pain / pressure.  Patient states pain start red this morning and worsened while she was at work.  States she did feel dizzy while at work as well.  Describes current discomfort as pressure and denies SOB/DOE.  Patient is 2 months post partum
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
## 2016-02-29 ENCOUNTER — Emergency Department
Admission: EM | Admit: 2016-02-29 | Discharge: 2016-02-29 | Disposition: A | Discharge status: Home / Self Care | Payer: Medicaid Other | Attending: Emergency Medicine | Admitting: Emergency Medicine

## 2016-02-29 ENCOUNTER — Encounter: Payer: Self-pay | Admitting: Emergency Medicine

## 2016-02-29 DIAGNOSIS — L03031 Cellulitis of right toe: Secondary | ICD-10-CM | POA: Diagnosis not present

## 2016-02-29 DIAGNOSIS — M79674 Pain in right toe(s): Secondary | ICD-10-CM | POA: Diagnosis present

## 2016-02-29 MED ORDER — CEPHALEXIN 500 MG PO CAPS: 500.0000 mg | ORAL_CAPSULE | Freq: Two times a day (BID) | ORAL | 0 refills | Status: AC

## 2016-02-29 NOTE — ED Notes (Signed)
Pt stating that she began with pain last week in her 1st toe on right foot. Pt stating that it began leaking green on Wednesday. Pt stating that the pain has decreased but she came in because she wanted to find out what was happening. Pt has minimal swelling to 1st right medial toe. Some clear drainage noted at this time.

## 2016-02-29 NOTE — Discharge Instructions (Signed)
Soak in warm Epson salt water 4 times per day until symptoms have completely resolved. Take the Keflex until finished. [facility] or Dr. Orland Jarredroxler for symptoms that are not improving over the next week. Return to the emergency department for symptoms that change or worsen if you are unable to schedule an appointment.

## 2016-02-29 NOTE — ED Triage Notes (Signed)
C/O ingrown toe nail to right great toe.  Describes toe as swollen, painful and green/yellow drainage from area.
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
## 2016-03-28 ENCOUNTER — Emergency Department
Admission: EM | Admit: 2016-03-28 | Discharge: 2016-03-28 | Disposition: A | Discharge status: Home / Self Care | Payer: Medicaid Other | Attending: Emergency Medicine | Admitting: Emergency Medicine

## 2016-03-28 ENCOUNTER — Encounter: Payer: Self-pay | Admitting: Urgent Care

## 2016-03-28 DIAGNOSIS — M79674 Pain in right toe(s): Secondary | ICD-10-CM | POA: Diagnosis present

## 2016-03-28 DIAGNOSIS — L03031 Cellulitis of right toe: Secondary | ICD-10-CM | POA: Diagnosis not present

## 2016-03-28 DIAGNOSIS — Z79899 Other long term (current) drug therapy: Secondary | ICD-10-CM | POA: Diagnosis not present

## 2016-03-28 MED ORDER — CEPHALEXIN 500 MG PO CAPS
500.0000 mg | ORAL_CAPSULE | Freq: Once | ORAL | Status: AC
  Administered 2016-03-28: 500 mg via ORAL
  Filled 2016-03-28: qty 1

## 2016-03-28 MED ORDER — CEPHALEXIN 500 MG PO CAPS: 500.0000 mg | ORAL_CAPSULE | Freq: Four times a day (QID) | ORAL | 0 refills | Status: DC
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
## 2016-03-28 NOTE — ED Triage Notes (Signed)
Patient presents with RIGHT great toe pain x 1-2 months. (+) swelling and drainage noted. Denies fever.

## 2016-03-28 NOTE — ED Notes (Signed)
Patient reports that she has right great toe pain for approximately a month.  Patient states "I think the nail is ingrown."  Right great toe with slight redness noted.

## 2016-04-07 ENCOUNTER — Emergency Department: Payer: Medicaid Other

## 2016-04-07 ENCOUNTER — Encounter: Payer: Self-pay | Admitting: Emergency Medicine

## 2016-04-07 ENCOUNTER — Emergency Department
Admission: EM | Admit: 2016-04-07 | Discharge: 2016-04-07 | Disposition: A | Discharge status: Home / Self Care | Payer: Medicaid Other | Attending: Emergency Medicine | Admitting: Emergency Medicine

## 2016-04-07 DIAGNOSIS — R0789 Other chest pain: Secondary | ICD-10-CM | POA: Diagnosis present

## 2016-04-07 DIAGNOSIS — K8020 Calculus of gallbladder without cholecystitis without obstruction: Secondary | ICD-10-CM | POA: Diagnosis not present

## 2016-04-07 DIAGNOSIS — R1010 Upper abdominal pain, unspecified: Secondary | ICD-10-CM

## 2016-04-07 HISTORY — DX: Iron deficiency anemia, unspecified: D50.9

## 2016-04-07 LAB — CBC
HEMATOCRIT: 31.9 % — AB (ref 35.0–47.0)
HEMOGLOBIN: 10.3 g/dL — AB (ref 12.0–16.0)
MCH: 22.6 pg — AB (ref 26.0–34.0)
MCHC: 32.4 g/dL (ref 32.0–36.0)
MCV: 69.7 fL — AB (ref 80.0–100.0)
Platelets: 442 10*3/uL — ABNORMAL HIGH (ref 150–440)
RBC: 4.58 MIL/uL (ref 3.80–5.20)
RDW: 21.7 % — AB (ref 11.5–14.5)
WBC: 4.4 10*3/uL (ref 3.6–11.0)

## 2016-04-07 LAB — COMPREHENSIVE METABOLIC PANEL
ALBUMIN: 4 g/dL (ref 3.5–5.0)
ALK PHOS: 81 U/L (ref 38–126)
ALT: 343 U/L — ABNORMAL HIGH (ref 14–54)
ANION GAP: 8 (ref 5–15)
AST: 477 U/L — ABNORMAL HIGH (ref 15–41)
BILIRUBIN TOTAL: 1.5 mg/dL — AB (ref 0.3–1.2)
BUN: 9 mg/dL (ref 6–20)
CALCIUM: 9.3 mg/dL (ref 8.9–10.3)
CO2: 24 mmol/L (ref 22–32)
Chloride: 107 mmol/L (ref 101–111)
Creatinine, Ser: 0.52 mg/dL (ref 0.44–1.00)
GFR calc Af Amer: >60 mL/min (ref >60)
GLUCOSE: 119 mg/dL — AB (ref 65–99)
POTASSIUM: 3.6 mmol/L (ref 3.5–5.1)
Sodium: 139 mmol/L (ref 135–145)
TOTAL PROTEIN: 7.9 g/dL (ref 6.5–8.1)

## 2016-04-07 LAB — POCT PREGNANCY, URINE: Preg Test, Ur: NEGATIVE

## 2016-04-07 LAB — TROPONIN I

## 2016-04-07 MED ORDER — ACETAMINOPHEN 325 MG PO TABS
ORAL_TABLET | ORAL | Status: AC
  Administered 2016-04-07: 650 mg via ORAL
  Filled 2016-04-07: qty 2

## 2016-04-07 MED ORDER — HYDROCODONE-ACETAMINOPHEN 5-325 MG PO TABS: 1.0000 | ORAL_TABLET | Freq: Four times a day (QID) | ORAL | 0 refills | Status: DC | PRN

## 2016-04-07 MED ORDER — ACETAMINOPHEN 325 MG PO TABS
650.0000 mg | ORAL_TABLET | Freq: Once | ORAL | Status: AC
  Administered 2016-04-07: 650 mg via ORAL

## 2016-04-07 MED ORDER — ONDANSETRON HCL 4 MG PO TABS: 4.0000 mg | ORAL_TABLET | Freq: Three times a day (TID) | ORAL | 0 refills | Status: DC | PRN

## 2016-04-07 NOTE — ED Provider Notes (Signed)
[facility]  I accepted care from Dr. Cyril LoosenKinner ____________________________________________    LABS (pertinent positives/negatives)  Labs Reviewed  CBC - Abnormal; Notable for the following:       Result Value   Hemoglobin 10.3 (*)    HCT 31.9 (*)    MCV 69.7 (*)    MCH 22.6 (*)    RDW 21.7 (*)    Platelets 442 (*)    All other components within normal limits  COMPREHENSIVE METABOLIC PANEL - Abnormal; Notable for the following:    Glucose, Bld 119 (*)    AST 477 (*)    ALT 343 (*)    Total Bilirubin 1.5 (*)    All other components within normal limits  TROPONIN I  POCT PREGNANCY, URINE     ____________________________________________    RADIOLOGY All xrays were viewed by me. Imaging interpreted by radiologist.  RUQ ultrasound:  IMPRESSION: Gallstones without sonographic evidence of acute cholecystitis.  Mild dilation of the common bile duct to as much as 8.3 mm at the level of the pancreatic head. No intraluminal stones are observed.  Mild hepatic echotexture heterogeneity without discrete mass or other acute abnormality.  ____________________________________________   PROCEDURES  Procedure(s) performed: None  Critical Care performed: None  ____________________________________________   INITIAL IMPRESSION / ASSESSMENT AND PLAN / ED COURSE   Pertinent labs & imaging results that were available during my care of the patient were reviewed by me and considered in my medical decision making (see chart for details).  Accepted from Dr. Cyril LoosenKinner at shift change - awaiting ruq ultrasound due to epigastric discomfort and elevated lfts.  Ultrasound confirms gallstones, shows slightly elevated common bile duct. On re-examination with the patient around 9:30, patient is now asymptomatic without any discomfort at all. I discussed with her the finding of gallstones, and the elevated LFTs, bile duct which could indicate an obstructing stone versus  recently passed. Given that she is asymptomatic, I spoke with Dr. Tonita CongWoodham, and agreed patient can be discharged home to follow-up as an outpatient. We discussed return precautions.  CONSULTATIONS: Phone consult with Dr. Tonita CongWoodham, as patient is now asymptomatic, ok for outpatient discharge and follow up.    Patient / Family / Caregiver informed of clinical course, medical decision-making process, and agree with plan.    ____________________________________________   FINAL CLINICAL IMPRESSION(S) / ED DIAGNOSES  Final diagnoses:  Upper abdominal pain  Gallstones        Governor Rooksebecca Chaynce Schafer, MD 04/07/16 [phone]

## 2016-04-07 NOTE — ED Notes (Signed)
Xray completed
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
## 2016-04-07 NOTE — ED Triage Notes (Signed)
Per EMS pt woke up with CP and felt SOB.  Pt states the pain felt like heaviness on her chest and says the feeling is still there during triage but is not as bad.  MD is present at bedside during triage.  EMS says her vitals were WNL during transport.  Pt denies any hx of anxiety or breathing problems.

## 2016-04-07 NOTE — ED Notes (Signed)
Family at bedside. 

## 2016-04-07 NOTE — Discharge Instructions (Signed)
From Dr. Shaune PollackLord:  You were evaluated for upper abdominal pain and found to have gallstones. Your ultrasound indicated the possibility of a gallstone being stuck or recently passed. Given that your pain is gone, we discussed okay for discharge home now, but you do need to follow-up with a general surgeon, and are referred to call the office for Dr. Tonita CongWoodham.  Return to the emergency department immediately for any new or worsening abdominal pain, fever, vomiting, or any other symptoms concerning to you.

## 2016-04-07 NOTE — ED Notes (Signed)
X-ray at bedside

## 2016-04-27 ENCOUNTER — Encounter: Payer: Self-pay | Admitting: General Surgery

## 2016-04-27 ENCOUNTER — Ambulatory Visit (INDEPENDENT_AMBULATORY_CARE_PROVIDER_SITE_OTHER): Payer: Medicaid Other | Admitting: General Surgery

## 2016-04-27 VITALS — BP 135/74 | HR 84 | Temp 98.3°F | Wt 215.0 lb

## 2016-04-27 DIAGNOSIS — K8021 Calculus of gallbladder without cholecystitis with obstruction: Secondary | ICD-10-CM

## 2016-04-27 NOTE — Patient Instructions (Addendum)
Please look at your blue sheet if you have any questions about your surgery.   Angie will be calling you in reference to your surgery.

## 2016-04-27 NOTE — Progress Notes (Signed)
Patient ID: Katherine Downs, female   DOB: 07/21/1995, 20 y.o.   MRN: [id number]  CC: ABDOMINAL PAIN  HPI Katherine Downs is a 20 y.o. female who presents to clinic for follow-up from her recent ER visit for upper abdominal pain and chest pain. During that visit she was found to have cholelithiasis but without evidence of cholecystitis. She did however have some LFT abdomen abnormalities. Patient reports that the night before this attack she has been eating pizza and fried chicken wings. The pain is sharp and stabbing into her right upper quadrant and into her chest. She reports that whenever she eats spicy or fatty foods the pain is worsened. Everyone in her family starting had her gallbladder removed for similar symptoms. She states primarily pain that goes into her chest which made him think it was something other than her gallbladder. She denies any fevers, chills, vomiting, constipation. She does states she's had some nausea with the pains as well as diarrhea when the pain is at its worst. She is otherwise in her usual state of health.  HPI  Past Medical History:  Diagnosis Date  . Iron (Fe) deficiency anemia   . Medical history non-contributory     Past Surgical History:  Procedure Laterality Date  . CESAREAN SECTION    . NO PAST SURGERIES      Family History  Problem Relation Age of Onset  . Healthy Mother   . Healthy Father     Social History Social History  Substance Use Topics  . Smoking status: Never Smoker  . Smokeless tobacco: Never Used  . Alcohol use Yes     Comment: ocassional    Allergies  Allergen Reactions  . Ibuprofen Hives    Current Outpatient Prescriptions  Medication Sig Dispense Refill  . ferrous sulfate 325 (65 FE) MG tablet Take 1 tablet (325 mg total) by mouth daily. 30 tablet 3  . ondansetron (ZOFRAN) 4 MG tablet Take 1 tablet (4 mg total) by mouth every 8 (eight) hours as needed for nausea or vomiting. 10 tablet 0   No current  facility-administered medications for this visit.      Review of Systems A Multi-point review of systems was asked and was negative except for the findings documented in the history of present illness  Physical Exam Blood pressure 135/74, pulse 84, temperature 98.3 F (36.8 C), temperature source Oral, weight 97.5 kg (215 lb), last menstrual period 02/05/2015, unknown if currently breastfeeding. CONSTITUTIONAL: No acute distress. EYES: Pupils are equal, round, and reactive to light, Sclera are non-icteric. EARS, NOSE, MOUTH AND THROAT: The oropharynx is clear. The oral mucosa is pink and moist. Hearing is intact to voice. LYMPH NODES:  Lymph nodes in the neck are normal. RESPIRATORY:  Lungs are clear. There is normal respiratory effort, with equal breath sounds bilaterally, and without pathologic use of accessory muscles. CARDIOVASCULAR: Heart is regular without murmurs, gallops, or rubs. GI: The abdomen is soft, nontender, and nondistended. There are no palpable masses. There is no hepatosplenomegaly. There are normal bowel sounds in all quadrants. GU: Rectal deferred.   MUSCULOSKELETAL: Normal muscle strength and tone. No cyanosis or edema.   SKIN: Turgor is good and there are no pathologic skin lesions or ulcers. NEUROLOGIC: Motor and sensation is grossly normal. Cranial nerves are grossly intact. PSYCH:  Oriented to person, place and time. Affect is normal.  Data Reviewed Images and labs reviewed. Ultrasound the right upper quadrant does show gallstones but without any gallbladder wall   thickening, pericholecystic fluid, obvious ductal dilatation. Labs show a normal white blood cell count of 4.4, but an elevated bilirubin of 1.5, AST of 477, AST of 342. I have personally reviewed the patient's imaging, laboratory findings and medical records.    Assessment    Symptomatic cholelithiasis    Plan    20 year old female with symptomatic cholelithiasis. I discussed the procedure of a  laparoscopic cholecystectomy with cholangiogram in detail.  The patient was given Agricultural engineereducational material.  We discussed the risks and benefits of a laparoscopic cholecystectomy and possible cholangiogram including, but not limited to bleeding, infection, injury to surrounding structures such as the intestine or liver, bile leak, retained gallstones, need to convert to an open procedure, prolonged diarrhea, blood clots such as  DVT, common bile duct injury, anesthesia risks, and possible need for additional procedures.  The likelihood of improvement in symptoms and return to the patient's normal status is good. We discussed the typical post-operative recovery course. Patient voiced understanding and desired to proceed. We will plan for a laparoscopic cholecystectomy with cholangiogram on Monday, December 18.      Time spent with the patient was 45 minutes, with more than 50% of the time spent in face-to-face education, counseling and care coordination.     Ricarda Frameharles Maley Venezia, MD FACS General Surgeon 04/27/2016, 11:19 AM

## 2016-04-29 ENCOUNTER — Telehealth: Payer: Self-pay | Admitting: General Surgery

## 2016-04-29 NOTE — Telephone Encounter (Signed)
Pt advised of pre op date/time and sx date. Sx: 05/11/16 with Dr Devoria AlbeWoodham--Laparoscopic cholecystectomy with IOC.  Pre op: 05/04/16 between 1-5:00pm--Phone.   Patient made aware to call [phone], between 1-3:00pm the day before surgery, to find out what time to arrive.

## 2016-05-04 ENCOUNTER — Inpatient Hospital Stay
Admission: RE | Admit: 2016-05-04 | Discharge: 2016-05-04 | Disposition: A | Discharge status: Home / Self Care | Payer: Medicaid Other | Source: Ambulatory Visit

## 2016-05-04 NOTE — Pre-Procedure Instructions (Signed)
EKG  ED ECG REPORT I, Jene EveryKINNER, ROBERT, the attending physician, personally viewed and interpreted this ECG.  Date: 04/07/2016 EKG Time: 6:03 AM Rate: 79 Rhythm: normal sinus rhythm QRS Axis: normal Intervals: normal ST/T Wave abnormalities: normal Conduction Disturbances: none Narrative Interpretation: unremarkable

## 2016-05-05 ENCOUNTER — Encounter
Admission: RE | Admit: 2016-05-05 | Discharge: 2016-05-05 | Disposition: A | Discharge status: Home / Self Care | Payer: Medicaid Other | Source: Ambulatory Visit | Attending: General Surgery | Admitting: General Surgery

## 2016-05-05 ENCOUNTER — Encounter: Payer: Self-pay | Admitting: *Deleted

## 2016-05-05 DIAGNOSIS — D649 Anemia, unspecified: Secondary | ICD-10-CM | POA: Insufficient documentation

## 2016-05-05 DIAGNOSIS — Z01812 Encounter for preprocedural laboratory examination: Secondary | ICD-10-CM | POA: Diagnosis not present

## 2016-05-05 LAB — HEMOGLOBIN: Hemoglobin: 10.8 g/dL — ABNORMAL LOW (ref 12.0–16.0)

## 2016-05-05 NOTE — Patient Instructions (Signed)
  Your procedure is scheduled on: 05-11-16 [facility](MONDAY) Report to Same Day Surgery 2nd floor medical mall [facility](Medical Mall Entrance-take elevator on left to 2nd floor.  Check in with surgery information desk.) To find out your arrival time please call [phone] between 1PM - 3PM on 05-08-16 (FRIDAY)  Remember: Instructions that are not followed completely may result in serious medical risk, up to and including death, or upon the discretion of your surgeon and anesthesiologist your surgery may need to be rescheduled.    _x___ 1. Do not eat food or drink liquids after midnight. No gum chewing or hard candies.     __x__ 2. No Alcohol for 24 hours before or after surgery.   __x__3. No Smoking for 24 prior to surgery.   ____  4. Bring all medications with you on the day of surgery if instructed.    __x__ 5. Notify your doctor if there is any change in your medical condition     (cold, fever, infections).     Do not wear jewelry, make-up, hairpins, clips or nail polish.  Do not wear lotions, powders, or perfumes. You may wear deodorant.  Do not shave 48 hours prior to surgery. Men may shave face and neck.  Do not bring valuables to the hospital.    [facility] is not responsible for any belongings or valuables.               Contacts, dentures or bridgework may not be worn into surgery.  Leave your suitcase in the car. After surgery it may be brought to your room.  For patients admitted to the hospital, discharge time is determined by your treatment team.   Patients discharged the day of surgery will not be allowed to drive home.  You will need someone to drive you home and stay with you the night of your procedure.    Please read over the following fact sheets that you were given:   [facility] Preparing for Surgery and or MRSA Information   ____ Take these medicines the morning of surgery with A SIP OF WATER:    1. NONE  2.  3.  4.  5.  6.  ____Fleets enema or Magnesium Citrate as  directed.   _x___ Use CHG Soap or sage wipes as directed on instruction sheet   ____ Use inhalers on the day of surgery and bring to hospital day of surgery  ____ Stop metformin 2 days prior to surgery    ____ Take 1/2 of usual insulin dose the night before surgery and none on the morning of surgery.   ____ Stop Aspirin, Coumadin, Pllavix ,Eliquis, Effient, or Pradaxa  x__ Stop Anti-inflammatories such as Advil, Aleve, Ibuprofen, Motrin, Naproxen,          Naprosyn, Goodies powders or aspirin products. Ok to take Tylenol.   ____ Stop supplements until after surgery.    ____ Bring C-Pap to the hospital.

## 2016-05-11 ENCOUNTER — Ambulatory Visit: Payer: Medicaid Other | Admitting: Anesthesiology

## 2016-05-11 ENCOUNTER — Ambulatory Visit: Payer: Medicaid Other

## 2016-05-11 ENCOUNTER — Encounter
Admission: RE | Disposition: A | Discharge status: Home / Self Care | Payer: Self-pay | Source: Ambulatory Visit | Attending: General Surgery

## 2016-05-11 ENCOUNTER — Encounter: Payer: Self-pay | Admitting: Anesthesiology

## 2016-05-11 ENCOUNTER — Ambulatory Visit
Admission: RE | Admit: 2016-05-11 | Discharge: 2016-05-11 | Disposition: A | Discharge status: Home / Self Care | Payer: Medicaid Other | Source: Ambulatory Visit | Attending: General Surgery | Admitting: General Surgery

## 2016-05-11 DIAGNOSIS — Z886 Allergy status to analgesic agent status: Secondary | ICD-10-CM | POA: Diagnosis not present

## 2016-05-11 DIAGNOSIS — K8020 Calculus of gallbladder without cholecystitis without obstruction: Secondary | ICD-10-CM | POA: Diagnosis not present

## 2016-05-11 DIAGNOSIS — D649 Anemia, unspecified: Secondary | ICD-10-CM | POA: Insufficient documentation

## 2016-05-11 DIAGNOSIS — K8010 Calculus of gallbladder with chronic cholecystitis without obstruction: Secondary | ICD-10-CM | POA: Insufficient documentation

## 2016-05-11 DIAGNOSIS — K819 Cholecystitis, unspecified: Secondary | ICD-10-CM | POA: Diagnosis present

## 2016-05-11 DIAGNOSIS — K219 Gastro-esophageal reflux disease without esophagitis: Secondary | ICD-10-CM | POA: Diagnosis not present

## 2016-05-11 HISTORY — DX: Headache: R51

## 2016-05-11 HISTORY — DX: Headache, unspecified: R51.9

## 2016-05-11 HISTORY — DX: Gastro-esophageal reflux disease without esophagitis: K21.9

## 2016-05-11 HISTORY — PX: CHOLECYSTECTOMY: SHX55

## 2016-05-11 LAB — POCT PREGNANCY, URINE: Preg Test, Ur: NEGATIVE

## 2016-05-11 SURGERY — LAPAROSCOPIC CHOLECYSTECTOMY WITH INTRAOPERATIVE CHOLANGIOGRAM
Anesthesia: General | Wound class: Clean Contaminated

## 2016-05-11 MED ORDER — FAMOTIDINE 20 MG PO TABS
ORAL_TABLET | ORAL | Status: AC
  Administered 2016-05-11: 20 mg via ORAL
  Filled 2016-05-11: qty 1

## 2016-05-11 MED ORDER — LACTATED RINGERS IV SOLN
INTRAVENOUS | Status: DC
  Administered 2016-05-11: 09:00:00 via INTRAVENOUS

## 2016-05-11 MED ORDER — SUGAMMADEX SODIUM 200 MG/2ML IV SOLN
INTRAVENOUS | Status: DC | PRN
  Administered 2016-05-11: 200 mg via INTRAVENOUS

## 2016-05-11 MED ORDER — ONDANSETRON HCL 4 MG/2ML IJ SOLN
INTRAMUSCULAR | Status: DC | PRN
  Administered 2016-05-11: 4 mg via INTRAVENOUS

## 2016-05-11 MED ORDER — LIDOCAINE HCL (PF) 1 % IJ SOLN
INTRAMUSCULAR | Status: AC
  Filled 2016-05-11: qty 30

## 2016-05-11 MED ORDER — IOTHALAMATE MEGLUMINE 60 % INJ SOLN
INTRAMUSCULAR | Status: DC | PRN
  Administered 2016-05-11: 10 mL

## 2016-05-11 MED ORDER — BUPIVACAINE HCL (PF) 0.5 % IJ SOLN
INTRAMUSCULAR | Status: AC
  Filled 2016-05-11: qty 30

## 2016-05-11 MED ORDER — LIDOCAINE HCL 1 % IJ SOLN
INTRAMUSCULAR | Status: DC | PRN
  Administered 2016-05-11: 50 mL via INTRAMUSCULAR

## 2016-05-11 MED ORDER — MIDAZOLAM HCL 2 MG/2ML IJ SOLN
INTRAMUSCULAR | Status: DC | PRN
  Administered 2016-05-11: 2 mg via INTRAVENOUS

## 2016-05-11 MED ORDER — FENTANYL CITRATE (PF) 100 MCG/2ML IJ SOLN
INTRAMUSCULAR | Status: DC | PRN
  Administered 2016-05-11 (×3): 50 ug via INTRAVENOUS
  Administered 2016-05-11: 100 ug via INTRAVENOUS

## 2016-05-11 MED ORDER — HYDROCODONE-ACETAMINOPHEN 5-325 MG PO TABS: 1.0000 | ORAL_TABLET | Freq: Four times a day (QID) | ORAL | 0 refills | Status: DC | PRN

## 2016-05-11 MED ORDER — ONDANSETRON HCL 4 MG/2ML IJ SOLN
4.0000 mg | Freq: Once | INTRAMUSCULAR | Status: AC | PRN
  Administered 2016-05-11: 4 mg via INTRAVENOUS

## 2016-05-11 MED ORDER — FENTANYL CITRATE (PF) 100 MCG/2ML IJ SOLN
INTRAMUSCULAR | Status: AC
  Administered 2016-05-11: 25 ug via INTRAVENOUS
  Filled 2016-05-11: qty 2

## 2016-05-11 MED ORDER — DEXAMETHASONE SODIUM PHOSPHATE 10 MG/ML IJ SOLN
INTRAMUSCULAR | Status: DC | PRN
  Administered 2016-05-11: 10 mg via INTRAVENOUS

## 2016-05-11 MED ORDER — PROPOFOL 10 MG/ML IV BOLUS
INTRAVENOUS | Status: DC | PRN
  Administered 2016-05-11: 180 mg via INTRAVENOUS

## 2016-05-11 MED ORDER — DEXTROSE 5 % IV SOLN
1.0000 g | INTRAVENOUS | Status: AC
  Administered 2016-05-11: 1 g via INTRAVENOUS
  Filled 2016-05-11: qty 1

## 2016-05-11 MED ORDER — ONDANSETRON HCL 4 MG/2ML IJ SOLN
INTRAMUSCULAR | Status: AC
  Administered 2016-05-11: 4 mg via INTRAVENOUS
  Filled 2016-05-11: qty 2

## 2016-05-11 MED ORDER — LIDOCAINE HCL (CARDIAC) 20 MG/ML IV SOLN
INTRAVENOUS | Status: DC | PRN
  Administered 2016-05-11: 80 mg via INTRAVENOUS

## 2016-05-11 MED ORDER — CHLORHEXIDINE GLUCONATE CLOTH 2 % EX PADS: 6.0000 | MEDICATED_PAD | Freq: Once | CUTANEOUS | Status: DC

## 2016-05-11 MED ORDER — ROCURONIUM BROMIDE 100 MG/10ML IV SOLN
INTRAVENOUS | Status: DC | PRN
  Administered 2016-05-11: 40 mg via INTRAVENOUS
  Administered 2016-05-11: 10 mg via INTRAVENOUS

## 2016-05-11 MED ORDER — ONDANSETRON HCL 4 MG/2ML IJ SOLN: 4.0000 mg | Freq: Once | INTRAMUSCULAR | Status: DC

## 2016-05-11 MED ORDER — FAMOTIDINE 20 MG PO TABS
20.0000 mg | ORAL_TABLET | Freq: Once | ORAL | Status: AC
  Administered 2016-05-11: 20 mg via ORAL

## 2016-05-11 MED ORDER — FENTANYL CITRATE (PF) 100 MCG/2ML IJ SOLN
25.0000 ug | INTRAMUSCULAR | Status: DC | PRN
  Administered 2016-05-11 (×4): 25 ug via INTRAVENOUS

## 2016-05-11 SURGICAL SUPPLY — 48 items
ADH LQ OCL WTPRF AMP STRL LF (MISCELLANEOUS) ×1
ADHESIVE MASTISOL STRL (MISCELLANEOUS) ×3 IMPLANT
APPLIER CLIP ROT 10 11.4 M/L (STAPLE) ×3
APR CLP MED LRG 11.4X10 (STAPLE) ×1
BAG COUNTER SPONGE EZ (MISCELLANEOUS) ×1 IMPLANT
BAG SPNG 4X4 CLR HAZ (MISCELLANEOUS)
BLADE SURG SZ11 CARB STEEL (BLADE) ×3 IMPLANT
CANISTER SUCT 1200ML W/VALVE (MISCELLANEOUS) ×3 IMPLANT
CATH CHOLANG 76X19 KUMAR (CATHETERS) ×3 IMPLANT
CHLORAPREP W/TINT 26ML (MISCELLANEOUS) ×3 IMPLANT
CLIP APPLIE ROT 10 11.4 M/L (STAPLE) ×1 IMPLANT
CLOSURE WOUND 1/2 X4 (GAUZE/BANDAGES/DRESSINGS)
CONRAY 60ML FOR OR (MISCELLANEOUS) ×2 IMPLANT
COUNTER SPONGE BAG EZ (MISCELLANEOUS)
DRAPE SHEET LG 3/4 BI-LAMINATE (DRAPES) ×3 IMPLANT
DRESSING TELFA 4X3 1S ST N-ADH (GAUZE/BANDAGES/DRESSINGS) ×3 IMPLANT
DRSG TEGADERM 2-3/8X2-3/4 SM (GAUZE/BANDAGES/DRESSINGS) ×12 IMPLANT
ELECT REM PT RETURN 9FT ADLT (ELECTROSURGICAL) ×3
ELECTRODE REM PT RTRN 9FT ADLT (ELECTROSURGICAL) ×1 IMPLANT
GLOVE BIO SURGEON STRL SZ7.5 (GLOVE) ×9 IMPLANT
GLOVE INDICATOR 8.0 STRL GRN (GLOVE) ×3 IMPLANT
GOWN STRL REUS W/ TWL LRG LVL3 (GOWN DISPOSABLE) ×3 IMPLANT
GOWN STRL REUS W/TWL LRG LVL3 (GOWN DISPOSABLE) ×9
GRASPER SUT TROCAR 14GX15 (MISCELLANEOUS) ×1 IMPLANT
IRRIGATION STRYKERFLOW (MISCELLANEOUS) IMPLANT
IRRIGATOR STRYKERFLOW (MISCELLANEOUS)
IV NS 1000ML (IV SOLUTION) ×3
IV NS 1000ML BAXH (IV SOLUTION) IMPLANT
L-HOOK LAP DISP 36CM (ELECTROSURGICAL) ×3
LABEL OR SOLS (LABEL) ×3 IMPLANT
LHOOK LAP DISP 36CM (ELECTROSURGICAL) ×1 IMPLANT
NDL HYPO 25X1 1.5 SAFETY (NEEDLE) ×1 IMPLANT
NEEDLE HYPO 25X1 1.5 SAFETY (NEEDLE) ×3 IMPLANT
NEEDLE VERESS 14GA 120MM (NEEDLE) ×3 IMPLANT
NS IRRIG 500ML POUR BTL (IV SOLUTION) ×3 IMPLANT
PACK LAP CHOLECYSTECTOMY (MISCELLANEOUS) ×3 IMPLANT
PENCIL ELECTRO HAND CTR (MISCELLANEOUS) ×3 IMPLANT
POUCH ENDO CATCH 10MM SPEC (MISCELLANEOUS) ×3 IMPLANT
SCISSORS METZENBAUM CVD 33 (INSTRUMENTS) ×3 IMPLANT
SET SUCTION IRRIG HYDROSURG (IRRIGATION / IRRIGATOR) ×2 IMPLANT
SLEEVE ENDOPATH XCEL 5M (ENDOMECHANICALS) ×6 IMPLANT
STRIP CLOSURE SKIN 1/2X4 (GAUZE/BANDAGES/DRESSINGS) IMPLANT
SUT MNCRL 4-0 (SUTURE) ×3
SUT MNCRL 4-0 27XMFL (SUTURE) ×1
SUTURE MNCRL 4-0 27XMF (SUTURE) ×1 IMPLANT
TROCAR XCEL 12X100 BLDLESS (ENDOMECHANICALS) ×3 IMPLANT
TROCAR XCEL NON-BLD 5MMX100MML (ENDOMECHANICALS) ×3 IMPLANT
TUBING INSUFFLATOR HI FLOW (MISCELLANEOUS) ×3 IMPLANT

## 2016-05-11 NOTE — Interval H&P Note (Signed)
History and Physical Interval Note:  05/11/2016 9:00 AM  Katherine Downs  has presented today for surgery, with the diagnosis of calculus of gallbladder  The various methods of treatment have been discussed with the patient and family. After consideration of risks, benefits and other options for treatment, the patient has consented to  Procedure(s): LAPAROSCOPIC CHOLECYSTECTOMY WITH INTRAOPERATIVE CHOLANGIOGRAM (N/A) as a surgical intervention .  The patient's history has been reviewed, patient examined, no change in status, stable for surgery.  I have reviewed the patient's chart and labs.  Questions were answered to the patient's satisfaction.     Ricarda Frameharles Cobe Viney

## 2016-05-11 NOTE — Transfer of Care (Signed)
Immediate Anesthesia Transfer of Care Note  Patient: Katherine Downs  Procedure(s) Performed: Procedure(s): LAPAROSCOPIC CHOLECYSTECTOMY WITH INTRAOPERATIVE CHOLANGIOGRAM (N/A)  Patient Location: PACU  Anesthesia Type:General  Level of Consciousness: sedated  Airway & Oxygen Therapy: Patient Spontanous Breathing and Patient connected to face mask oxygen  Post-op Assessment: Report given to RN and Post -op Vital signs reviewed and stable  Post vital signs: Reviewed and stable  Last Vitals:  Vitals:   05/11/16 0834 05/11/16 1051  BP: 138/81 (!) 152/82  Pulse: 81 (!) 104  Resp: 16   Temp: (!) 35.8 C 36.2 C    Last Pain:  Vitals:   05/11/16 1051  TempSrc: Temporal         Complications: No apparent anesthesia complications

## 2016-05-11 NOTE — Anesthesia Procedure Notes (Signed)
Procedure Name: Intubation Date/Time: 05/11/2016 9:37 AM Performed by: Katherine CaldwellSTARR, Katherine Downs Pre-anesthesia Checklist: Patient identified, Emergency Drugs available, Suction available and Patient being monitored Patient Re-evaluated:Patient Re-evaluated prior to inductionOxygen Delivery Method: Circle system utilized Preoxygenation: Pre-oxygenation with 100% oxygen Intubation Type: IV induction Ventilation: Mask ventilation without difficulty Laryngoscope Size: Mac and 3 Grade View: Grade I Tube type: Oral Tube size: 7.0 mm Number of attempts: 1 Airway Equipment and Method: Stylet Placement Confirmation: ETT inserted through vocal cords under direct vision,  positive ETCO2 and breath sounds checked- equal and bilateral Secured at: 22 cm Tube secured with: Tape Dental Injury: Teeth and Oropharynx as per pre-operative assessment

## 2016-05-11 NOTE — Op Note (Signed)
Laparoscopic Cholecystectomy  Pre-operative Diagnosis: Previous cholecystitis  Post-operative Diagnosis: Same  Procedure: Laparoscopic cholecystectomy with cholangiogram  Surgeon: Leonette Mostharles T. Tonita CongWoodham, MD FACS  Anesthesia: Gen. with endotracheal tube  Assistant: None  Procedure Details  The patient was seen again in the Holding Room. The benefits, complications, treatment options, and expected outcomes were discussed with the patient. The risks of bleeding, infection, recurrence of symptoms, failure to resolve symptoms, bile duct damage, bile duct leak, retained common bile duct stone, bowel injury, any of which could require further surgery and/or ERCP, stent, or papillotomy were reviewed with the patient. The likelihood of improving the patient's symptoms with return to their baseline status is good.  The patient and/or family concurred with the proposed plan, giving informed consent.  The patient was taken to Operating Room, identified as Katherine Downs and the procedure verified as Laparoscopic Cholecystectomy.  A Time Out was held and the above information confirmed.  Prior to the induction of general anesthesia, antibiotic prophylaxis was administered. VTE prophylaxis was in place. General endotracheal anesthesia was then administered and tolerated well. After the induction, the abdomen was prepped with Chloraprep and draped in the sterile fashion. The patient was positioned in the supine position.  Local anesthetic  was injected into the skin near the umbilicus and an incision made. The Veress needle was placed. Pneumoperitoneum was then created with CO2 and tolerated well without any adverse changes in the patient's vital signs. A 5mm port was placed in the periumbilical position and the abdominal cavity was explored.  Two 5-mm ports were placed in the right upper quadrant and a 12 mm epigastric port was placed all under direct vision. All skin incisions  were infiltrated with a local  anesthetic agent before making the incision and placing the trocars.   The patient was positioned  in reverse Trendelenburg, tilted slightly to the patient's left.  The gallbladder was identified, the fundus grasped and retracted cephalad. Adhesions were lysed bluntly. The infundibulum was grasped and retracted laterally, exposing the peritoneum overlying the triangle of Calot. This was then divided and exposed in a blunt fashion. A critical view of the cystic duct and cystic artery was obtained.  The cystic duct was clearly identified and bluntly dissected. A single clip was placed on what was thought to be the cystic artery.  At this point a cholangiogram was performed using the Kumar clamp and catheter. A clamp was placed across the mid body of the gallbladder with the catheter being stuck into the infundibulum. Under fluoroscopy contrast was instilled into the gallbladder, cystic duct, common bile duct. It was seen refluxing into the right left hepatic and flowing into the small bowel. There were no Complications from the clinic gram and there was no evidence of common duct stone during the clinic gram. The catheter and clamp were then removed under direct visualization. The cystic duct and artery were then serially clipped with endoclips and cut with endoscopic shears.  The gallbladder was taken from the gallbladder fossa in a retrograde fashion with the electrocautery. The gallbladder was removed and placed in an Endocatch bag. The liver bed was irrigated and inspected. Hemostasis was achieved with the electrocautery. Copious irrigation was utilized and was repeatedly aspirated until clear.  The gallbladder and Endocatch sac were then removed through the epigastric port site.   Inspection of the right upper quadrant was performed. No bleeding, bile duct injury or leak, or bowel injury was noted. Pneumoperitoneum was released. 4-0 subcuticular Monocryl was used to  close the skin. Steristrips and  Mastisol and sterile dressings were  applied.  The patient was then extubated and brought to the recovery room in stable condition. Sponge, lap, and needle counts were correct at closure and at the conclusion of the case.   Findings: Previous Cholecystitis   Estimated Blood Loss: 10 mL         Drains: None         Specimens: Gallbladder           Complications: none               [name], MD, FACS

## 2016-05-11 NOTE — Anesthesia Postprocedure Evaluation (Signed)
Anesthesia Post Note  Patient: Katherine Downs  Procedure(s) Performed: Procedure(s) (LRB): LAPAROSCOPIC CHOLECYSTECTOMY WITH INTRAOPERATIVE CHOLANGIOGRAM (N/A)  Patient location during evaluation: PACU Anesthesia Type: General Level of consciousness: awake and alert and oriented Pain management: pain level controlled Vital Signs Assessment: post-procedure vital signs reviewed and stable Respiratory status: spontaneous breathing Cardiovascular status: blood pressure returned to baseline Anesthetic complications: no     Last Vitals:  Vitals:   05/11/16 1223 05/11/16 1341  BP: (!) 150/79 134/77  Pulse: 79 85  Resp: 18 18  Temp: 36.7 C     Last Pain:  Vitals:   05/11/16 1341  TempSrc:   PainSc: 4                  Tomorrow Dehaas

## 2016-05-11 NOTE — Brief Op Note (Signed)
05/11/2016  10:42 AM  PATIENT:  Katherine Downs  20 y.o. female  PRE-OPERATIVE DIAGNOSIS:  calculus of gallbladder  POST-OPERATIVE DIAGNOSIS:  calculus of gallbladder  PROCEDURE:  Procedure(s): LAPAROSCOPIC CHOLECYSTECTOMY WITH INTRAOPERATIVE CHOLANGIOGRAM (N/A)  SURGEON:  Surgeon(s) and Role:    * Ricarda Frameharles Aero Drummonds, MD - Primary  PHYSICIAN ASSISTANT:   ASSISTANTS: none   ANESTHESIA:   general  EBL:  Total I/O In: 500 [I.V.:500] Out: 5 [Blood:5]  BLOOD ADMINISTERED:none  DRAINS: none   LOCAL MEDICATIONS USED:  MARCAINE   , XYLOCAINE  and Amount: 20 ml  SPECIMEN:  Source of Specimen:  gallbladder  DISPOSITION OF SPECIMEN:  PATHOLOGY  COUNTS:  YES  TOURNIQUET:  * No tourniquets in log *  DICTATION: .Dragon Dictation  PLAN OF CARE: Discharge to home after PACU  PATIENT DISPOSITION:  PACU - hemodynamically stable.   Delay start of Pharmacological VTE agent (>24hrs) due to surgical blood loss or risk of bleeding: not applicable

## 2016-05-11 NOTE — H&P (View-Only) (Signed)
Patient ID: Katherine Downs, female   DOB: 11/11/1995, 20 y.o.   MRN: [id number]  CC: ABDOMINAL PAIN  HPI Katherine Downs is a 20 y.o. female who presents to clinic for follow-up from her recent ER visit for upper abdominal pain and chest pain. During that visit she was found to have cholelithiasis but without evidence of cholecystitis. She did however have some LFT abdomen abnormalities. Patient reports that the night before this attack she has been eating pizza and fried chicken wings. The pain is sharp and stabbing into her right upper quadrant and into her chest. She reports that whenever she eats spicy or fatty foods the pain is worsened. Everyone in her family starting had her gallbladder removed for similar symptoms. She states primarily pain that goes into her chest which made him think it was something other than her gallbladder. She denies any fevers, chills, vomiting, constipation. She does states she's had some nausea with the pains as well as diarrhea when the pain is at its worst. She is otherwise in her usual state of health.  HPI  Past Medical History:  Diagnosis Date  . Iron (Fe) deficiency anemia   . Medical history non-contributory     Past Surgical History:  Procedure Laterality Date  . CESAREAN SECTION    . NO PAST SURGERIES      Family History  Problem Relation Age of Onset  . Healthy Mother   . Healthy Father     Social History Social History  Substance Use Topics  . Smoking status: Never Smoker  . Smokeless tobacco: Never Used  . Alcohol use Yes     Comment: ocassional    Allergies  Allergen Reactions  . Ibuprofen Hives    Current Outpatient Prescriptions  Medication Sig Dispense Refill  . ferrous sulfate 325 (65 FE) MG tablet Take 1 tablet (325 mg total) by mouth daily. 30 tablet 3  . ondansetron (ZOFRAN) 4 MG tablet Take 1 tablet (4 mg total) by mouth every 8 (eight) hours as needed for nausea or vomiting. 10 tablet 0   No current  facility-administered medications for this visit.      Review of Systems A Multi-point review of systems was asked and was negative except for the findings documented in the history of present illness  Physical Exam Blood pressure 135/74, pulse 84, temperature 98.3 F (36.8 C), temperature source Oral, weight 97.5 kg (215 lb), last menstrual period 02/05/2015, unknown if currently breastfeeding. CONSTITUTIONAL: No acute distress. EYES: Pupils are equal, round, and reactive to light, Sclera are non-icteric. EARS, NOSE, MOUTH AND THROAT: The oropharynx is clear. The oral mucosa is pink and moist. Hearing is intact to voice. LYMPH NODES:  Lymph nodes in the neck are normal. RESPIRATORY:  Lungs are clear. There is normal respiratory effort, with equal breath sounds bilaterally, and without pathologic use of accessory muscles. CARDIOVASCULAR: Heart is regular without murmurs, gallops, or rubs. GI: The abdomen is soft, nontender, and nondistended. There are no palpable masses. There is no hepatosplenomegaly. There are normal bowel sounds in all quadrants. GU: Rectal deferred.   MUSCULOSKELETAL: Normal muscle strength and tone. No cyanosis or edema.   SKIN: Turgor is good and there are no pathologic skin lesions or ulcers. NEUROLOGIC: Motor and sensation is grossly normal. Cranial nerves are grossly intact. PSYCH:  Oriented to person, place and time. Affect is normal.  Data Reviewed Images and labs reviewed. Ultrasound the right upper quadrant does show gallstones but without any gallbladder wall  thickening, pericholecystic fluid, obvious ductal dilatation. Labs show a normal white blood cell count of 4.4, but an elevated bilirubin of 1.5, AST of 477, AST of 342. I have personally reviewed the patient's imaging, laboratory findings and medical records.    Assessment    Symptomatic cholelithiasis    Plan    20 year old female with symptomatic cholelithiasis. I discussed the procedure of a  laparoscopic cholecystectomy with cholangiogram in detail.  The patient was given Agricultural engineereducational material.  We discussed the risks and benefits of a laparoscopic cholecystectomy and possible cholangiogram including, but not limited to bleeding, infection, injury to surrounding structures such as the intestine or liver, bile leak, retained gallstones, need to convert to an open procedure, prolonged diarrhea, blood clots such as  DVT, common bile duct injury, anesthesia risks, and possible need for additional procedures.  The likelihood of improvement in symptoms and return to the patient's normal status is good. We discussed the typical post-operative recovery course. Patient voiced understanding and desired to proceed. We will plan for a laparoscopic cholecystectomy with cholangiogram on Monday, December 18.      Time spent with the patient was 45 minutes, with more than 50% of the time spent in face-to-face education, counseling and care coordination.     Ricarda Frameharles Paul Trettin, MD FACS General Surgeon 04/27/2016, 11:19 AM

## 2016-05-11 NOTE — Anesthesia Preprocedure Evaluation (Addendum)
Anesthesia Evaluation  Patient identified by MRN, date of birth, ID band Patient awake    Reviewed: Allergy & Precautions, NPO status , Patient's Chart, lab work & pertinent test results  Airway Mallampati: II  TM Distance: >3 FB     Dental no notable dental hx.    Pulmonary neg pulmonary ROS,    Pulmonary exam normal        Cardiovascular negative cardio ROS Normal cardiovascular exam     Neuro/Psych  Headaches, negative psych ROS   GI/Hepatic Neg liver ROS, GERD  Medicated,  Endo/Other  negative endocrine ROS  Renal/GU negative Renal ROS  negative genitourinary   Musculoskeletal negative musculoskeletal ROS (+)   Abdominal Normal abdominal exam  (+)   Peds negative pediatric ROS (+)  Hematology  (+) anemia ,   Anesthesia Other Findings   Reproductive/Obstetrics negative OB ROS                            Anesthesia Physical Anesthesia Plan  ASA: II  Anesthesia Plan: General   Post-op Pain Management:    Induction: Intravenous  Airway Management Planned: Oral ETT  Additional Equipment:   Intra-op Plan:   Post-operative Plan: Extubation in OR  Informed Consent: I have reviewed the patients History and Physical, chart, labs and discussed the procedure including the risks, benefits and alternatives for the proposed anesthesia with the patient or authorized representative who has indicated his/her understanding and acceptance.   Dental advisory given  Plan Discussed with: CRNA and Surgeon  Anesthesia Plan Comments:         Anesthesia Quick Evaluation

## 2016-05-11 NOTE — Discharge Instructions (Signed)
Laparoscopic Cholecystectomy, Care After °This sheet gives you information about how to care for yourself after your procedure. Your health care provider may also give you more specific instructions. If you have problems or questions, contact your health care provider. °What can I expect after the procedure? °After the procedure, it is common to have: °· Pain at your incision sites. You will be given medicines to control this pain. °· Mild nausea or vomiting. °· Bloating and possible shoulder pain from the air-like gas that was used during the procedure. °Follow these instructions at home: °Incision care  ° °· Follow instructions from your health care provider about how to take care of your incisions. Make sure you: °¨ Wash your hands with soap and water before you change your bandage (dressing). If soap and water are not available, use hand sanitizer. °¨ Change your dressing as told by your health care provider. °¨ Leave stitches (sutures), skin glue, or adhesive strips in place. These skin closures may need to be in place for 2 weeks or longer. If adhesive strip edges start to loosen and curl up, you may trim the loose edges. Do not remove adhesive strips completely unless your health care provider tells you to do that. °· Do not take baths, swim, or use a hot tub until your health care provider approves. Ask your health care provider if you can take showers. You may only be allowed to take sponge baths for bathing. °· Check your incision area every day for signs of infection. Check for: °¨ More redness, swelling, or pain. °¨ More fluid or blood. °¨ Warmth. °¨ Pus or a bad smell. °Activity  °· Do not drive or use heavy machinery while taking prescription pain medicine. °· Do not lift anything that is heavier than 10 lb (4.5 kg) until your health care provider approves. °· Do not play contact sports until your health care provider approves. °· Do not drive for 24 hours if you were given a medicine to help you relax  (sedative). °· Rest as needed. Do not return to work or school until your health care provider approves. °General instructions  °· Take over-the-counter and prescription medicines only as told by your health care provider. °· To prevent or treat constipation while you are taking prescription pain medicine, your health care provider may recommend that you: °¨ Drink enough fluid to keep your urine clear or pale yellow. °¨ Take over-the-counter or prescription medicines. °¨ Eat foods that are high in fiber, such as fresh fruits and vegetables, whole grains, and beans. °¨ Limit foods that are high in fat and processed sugars, such as fried and sweet foods. °Contact a health care provider if: °· You develop a rash. °· You have more redness, swelling, or pain around your incisions. °· You have more fluid or blood coming from your incisions. °· Your incisions feel warm to the touch. °· You have pus or a bad smell coming from your incisions. °· You have a fever. °· One or more of your incisions breaks open. °Get help right away if: °· You have trouble breathing. °· You have chest pain. °· You have increasing pain in your shoulders. °· You faint or feel dizzy when you stand. °· You have severe pain in your abdomen. °· You have nausea or vomiting that lasts for more than one day. °· You have leg pain. °This information is not intended to replace advice given to you by your health care provider. Make sure you discuss any   questions you have with your health care provider. °Document Released: 05/11/2005 Document Revised: 11/30/2015 Document Reviewed: 10/28/2015 °Elsevier Interactive Patient Education © 2017 [employer] ° °

## 2016-05-12 LAB — SURGICAL PATHOLOGY

## 2016-05-27 ENCOUNTER — Ambulatory Visit: Payer: Medicaid Other | Admitting: Surgery

## 2016-06-01 ENCOUNTER — Ambulatory Visit: Payer: Medicaid Other | Admitting: General Surgery

## 2016-06-08 ENCOUNTER — Ambulatory Visit: Payer: Medicaid Other | Admitting: Surgery

## 2016-08-10 ENCOUNTER — Encounter: Payer: Self-pay | Admitting: Intensive Care

## 2016-08-10 DIAGNOSIS — Z79899 Other long term (current) drug therapy: Secondary | ICD-10-CM | POA: Insufficient documentation

## 2016-08-10 DIAGNOSIS — M545 Low back pain: Secondary | ICD-10-CM | POA: Diagnosis not present

## 2016-08-10 NOTE — ED Triage Notes (Signed)
Patient presents to ER with lower back pain that started last Friday. Denies urinary symptoms or abdnormal discharge. 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 Ambulatory in triage with NAD noted

## 2016-08-11 ENCOUNTER — Emergency Department: Payer: Medicaid Other

## 2016-08-11 ENCOUNTER — Emergency Department
Admission: EM | Admit: 2016-08-11 | Discharge: 2016-08-11 | Disposition: A | Discharge status: Home / Self Care | Payer: Medicaid Other | Attending: Emergency Medicine | Admitting: Emergency Medicine

## 2016-08-11 DIAGNOSIS — M545 Low back pain, unspecified: Secondary | ICD-10-CM

## 2016-08-11 LAB — POCT PREGNANCY, URINE: Preg Test, Ur: NEGATIVE

## 2016-08-11 MED ORDER — LIDOCAINE 5 % EX PTCH
1.0000 | MEDICATED_PATCH | CUTANEOUS | Status: DC
  Administered 2016-08-11: 1 via TRANSDERMAL
  Filled 2016-08-11: qty 1

## 2016-08-11 MED ORDER — TRAMADOL HCL 50 MG PO TABS: 50.0000 mg | ORAL_TABLET | Freq: Four times a day (QID) | ORAL | 0 refills | Status: DC | PRN

## 2016-08-11 MED ORDER — LIDOCAINE 5 % EX PTCH: 1.0000 | MEDICATED_PATCH | Freq: Two times a day (BID) | CUTANEOUS | 0 refills | Status: DC

## 2016-08-11 MED ORDER — ACETAMINOPHEN 325 MG PO TABS
650.0000 mg | ORAL_TABLET | Freq: Once | ORAL | Status: AC
  Administered 2016-08-11: 650 mg via ORAL
  Filled 2016-08-11: qty 2

## 2016-08-11 NOTE — Discharge Instructions (Signed)
These follow-up with her primary care physician for further evaluation of her back pain.
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
## 2016-10-09 ENCOUNTER — Encounter (HOSPITAL_COMMUNITY): Payer: Self-pay

## 2016-10-09 ENCOUNTER — Emergency Department (HOSPITAL_COMMUNITY)
Admission: EM | Admit: 2016-10-09 | Discharge: 2016-10-10 | Disposition: A | Discharge status: Home / Self Care | Payer: Medicaid Other | Attending: Emergency Medicine | Admitting: Emergency Medicine

## 2016-10-09 DIAGNOSIS — Y939 Activity, unspecified: Secondary | ICD-10-CM | POA: Insufficient documentation

## 2016-10-09 DIAGNOSIS — W01190A Fall on same level from slipping, tripping and stumbling with subsequent striking against furniture, initial encounter: Secondary | ICD-10-CM | POA: Diagnosis not present

## 2016-10-09 DIAGNOSIS — S0502XA Injury of conjunctiva and corneal abrasion without foreign body, left eye, initial encounter: Secondary | ICD-10-CM | POA: Insufficient documentation

## 2016-10-09 DIAGNOSIS — Z79899 Other long term (current) drug therapy: Secondary | ICD-10-CM | POA: Insufficient documentation

## 2016-10-09 DIAGNOSIS — Y92009 Unspecified place in unspecified non-institutional (private) residence as the place of occurrence of the external cause: Secondary | ICD-10-CM | POA: Diagnosis not present

## 2016-10-09 DIAGNOSIS — S058X2A Other injuries of left eye and orbit, initial encounter: Secondary | ICD-10-CM

## 2016-10-09 DIAGNOSIS — Y998 Other external cause status: Secondary | ICD-10-CM | POA: Insufficient documentation

## 2016-10-09 DIAGNOSIS — S0592XA Unspecified injury of left eye and orbit, initial encounter: Secondary | ICD-10-CM | POA: Diagnosis present

## 2016-10-09 MED ORDER — TETRACAINE HCL 0.5 % OP SOLN
2.0000 [drp] | Freq: Once | OPHTHALMIC | Status: AC
  Administered 2016-10-09: 2 [drp] via OPHTHALMIC
  Filled 2016-10-09: qty 4

## 2016-10-09 MED ORDER — FLUORESCEIN SODIUM 0.6 MG OP STRP
ORAL_STRIP | OPHTHALMIC | Status: AC
  Filled 2016-10-09: qty 1

## 2016-10-09 MED ORDER — HYDROCODONE-ACETAMINOPHEN 5-325 MG PO TABS
1.0000 | ORAL_TABLET | Freq: Once | ORAL | Status: AC
  Administered 2016-10-09: 1 via ORAL
  Filled 2016-10-09: qty 1

## 2016-10-09 NOTE — ED Triage Notes (Signed)
Pt states she tripped and fell against the corner of a table hitting her left eye.  Pt reports pain to her eye and is unable to open her left eye.  No bleeding present
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
## 2016-10-10 MED ORDER — ERYTHROMYCIN 5 MG/GM OP OINT
TOPICAL_OINTMENT | Freq: Once | OPHTHALMIC | Status: AC
  Administered 2016-10-10: 01:00:00 via OPHTHALMIC
  Filled 2016-10-10: qty 3.5

## 2016-11-02 ENCOUNTER — Encounter: Payer: Self-pay | Admitting: Emergency Medicine

## 2016-11-02 ENCOUNTER — Emergency Department
Admission: EM | Admit: 2016-11-02 | Discharge: 2016-11-02 | Disposition: A | Discharge status: Home / Self Care | Payer: Medicaid Other | Attending: Emergency Medicine | Admitting: Emergency Medicine

## 2016-11-02 DIAGNOSIS — J029 Acute pharyngitis, unspecified: Secondary | ICD-10-CM | POA: Diagnosis present

## 2016-11-02 DIAGNOSIS — J069 Acute upper respiratory infection, unspecified: Secondary | ICD-10-CM

## 2016-11-02 DIAGNOSIS — R05 Cough: Secondary | ICD-10-CM | POA: Insufficient documentation

## 2016-11-02 DIAGNOSIS — Z79899 Other long term (current) drug therapy: Secondary | ICD-10-CM | POA: Diagnosis not present

## 2016-11-02 DIAGNOSIS — B9789 Other viral agents as the cause of diseases classified elsewhere: Secondary | ICD-10-CM

## 2016-11-02 LAB — POCT RAPID STREP A: STREPTOCOCCUS, GROUP A SCREEN (DIRECT): NEGATIVE

## 2016-11-02 MED ORDER — MAGIC MOUTHWASH W/LIDOCAINE: 5.0000 mL | Freq: Four times a day (QID) | ORAL | 0 refills | Status: DC

## 2016-11-02 MED ORDER — PSEUDOEPH-BROMPHEN-DM 30-2-10 MG/5ML PO SYRP: 5.0000 mL | ORAL_SOLUTION | Freq: Four times a day (QID) | ORAL | 0 refills | Status: DC | PRN

## 2016-11-02 NOTE — ED Triage Notes (Signed)
Pt reports sore throat and URI sx for a few days. Pt also reports cough.
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
## 2016-12-15 ENCOUNTER — Emergency Department (HOSPITAL_COMMUNITY)
Admission: EM | Admit: 2016-12-15 | Discharge: 2016-12-15 | Disposition: A | Discharge status: Home / Self Care | Payer: Medicaid Other | Attending: Emergency Medicine | Admitting: Emergency Medicine

## 2016-12-15 ENCOUNTER — Emergency Department (HOSPITAL_COMMUNITY): Payer: Medicaid Other

## 2016-12-15 DIAGNOSIS — W57XXXA Bitten or stung by nonvenomous insect and other nonvenomous arthropods, initial encounter: Secondary | ICD-10-CM | POA: Insufficient documentation

## 2016-12-15 DIAGNOSIS — Y929 Unspecified place or not applicable: Secondary | ICD-10-CM | POA: Diagnosis not present

## 2016-12-15 DIAGNOSIS — M25571 Pain in right ankle and joints of right foot: Secondary | ICD-10-CM | POA: Insufficient documentation

## 2016-12-15 DIAGNOSIS — Z79899 Other long term (current) drug therapy: Secondary | ICD-10-CM | POA: Insufficient documentation

## 2016-12-15 DIAGNOSIS — Y939 Activity, unspecified: Secondary | ICD-10-CM | POA: Insufficient documentation

## 2016-12-15 DIAGNOSIS — Y999 Unspecified external cause status: Secondary | ICD-10-CM | POA: Insufficient documentation

## 2016-12-15 DIAGNOSIS — S50862A Insect bite (nonvenomous) of left forearm, initial encounter: Secondary | ICD-10-CM | POA: Diagnosis present

## 2016-12-15 MED ORDER — CEPHALEXIN 500 MG PO CAPS: 500.0000 mg | ORAL_CAPSULE | Freq: Three times a day (TID) | ORAL | 0 refills | Status: DC

## 2016-12-15 NOTE — ED Notes (Signed)
Pt made aware to return if symptoms worsen or if any life threatening symptoms occur.   

## 2016-12-15 NOTE — Discharge Instructions (Signed)
Take over-the-counter Benadryl 1 capsule every 4-6 hours as ED for itching. Apply 1% hydrocortisone cream to the insect bite area 3 times a day as needed. Elevate and apply ice on and off to your ankle, where the brace as needed for weightbearing

## 2016-12-15 NOTE — ED Triage Notes (Signed)
Pt was outside this morning a was bitten on left forearm by what she thinks was a spider, pt fell trying to kill it and also has right pain.
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
## 2017-01-03 IMAGING — DX DG CHEST 1V PORT
1 series · 1 of 1 positions shown · non-contrast
Comparison: 01/10/2016

CLINICAL DATA: Awakened by chest pain and dyspnea.

EXAM:
PORTABLE CHEST 1 VIEW

[chest ap]
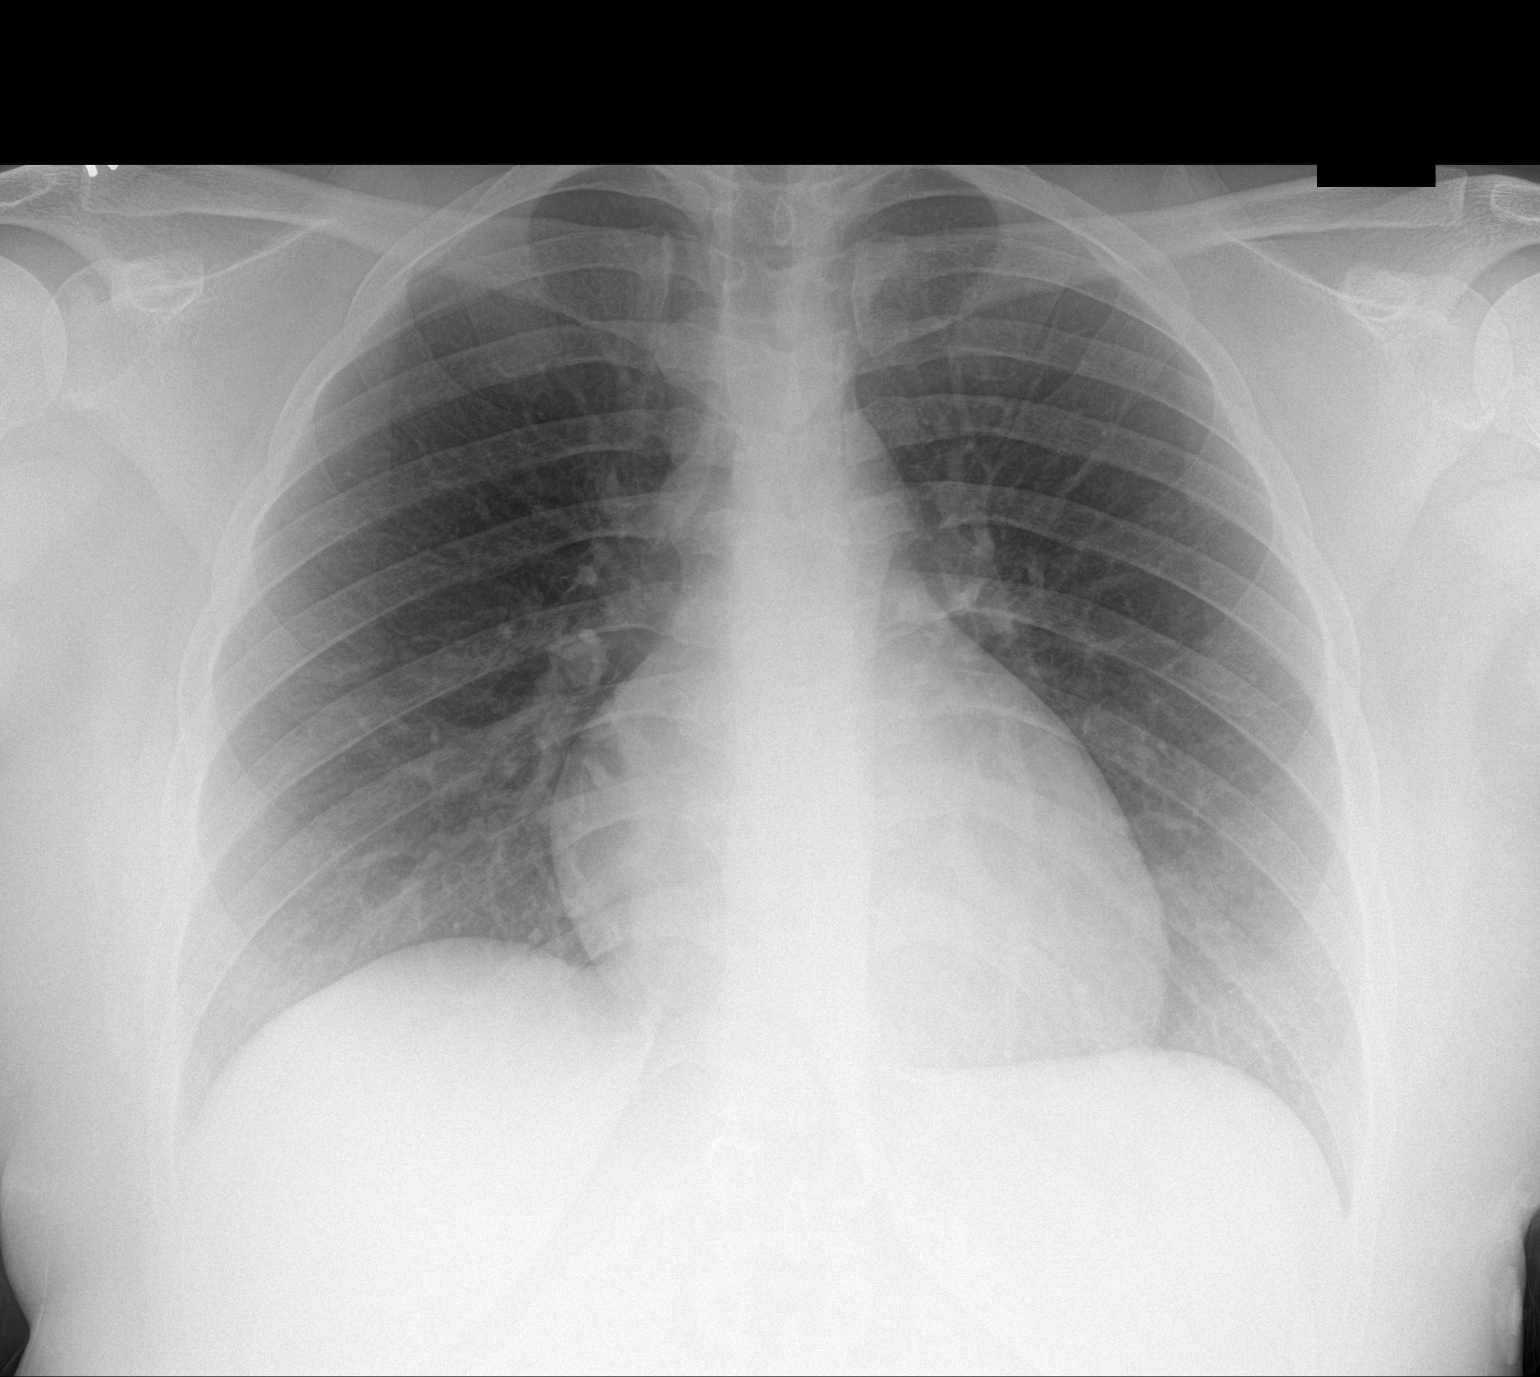

[1 of 1 positions shown; findings below may reference images not displayed]

FINDINGS: A single AP portable view of the chest demonstrates no focal
airspace consolidation or alveolar edema. The lungs are grossly
clear. There is no large effusion or pneumothorax. Cardiac and
mediastinal contours appear unremarkable.
IMPRESSION: No active disease.

## 2017-01-31 ENCOUNTER — Encounter (HOSPITAL_COMMUNITY): Payer: Self-pay | Admitting: *Deleted

## 2017-01-31 ENCOUNTER — Emergency Department (HOSPITAL_COMMUNITY)
Admission: EM | Admit: 2017-01-31 | Discharge: 2017-02-01 | Disposition: A | Discharge status: Home / Self Care | Payer: Medicaid Other | Attending: Emergency Medicine | Admitting: Emergency Medicine

## 2017-01-31 DIAGNOSIS — Z79899 Other long term (current) drug therapy: Secondary | ICD-10-CM | POA: Diagnosis not present

## 2017-01-31 DIAGNOSIS — R21 Rash and other nonspecific skin eruption: Secondary | ICD-10-CM | POA: Diagnosis present

## 2017-01-31 MED ORDER — PREDNISONE 10 MG PO TABS
60.0000 mg | ORAL_TABLET | Freq: Once | ORAL | Status: AC
  Administered 2017-01-31: 60 mg via ORAL
  Filled 2017-01-31: qty 1

## 2017-01-31 MED ORDER — DIPHENHYDRAMINE HCL 25 MG PO CAPS
25.0000 mg | ORAL_CAPSULE | Freq: Four times a day (QID) | ORAL | 0 refills | Status: DC | PRN
Start: 2017-01-31 — End: 2017-02-18

## 2017-01-31 MED ORDER — PREDNISONE 10 MG PO TABS: 40.0000 mg | ORAL_TABLET | Freq: Every day | ORAL | 0 refills | Status: AC

## 2017-01-31 MED ORDER — LORATADINE 10 MG PO TABS
10.0000 mg | ORAL_TABLET | Freq: Every day | ORAL | Status: DC
  Administered 2017-01-31: 10 mg via ORAL
  Filled 2017-01-31: qty 1

## 2017-01-31 NOTE — ED Triage Notes (Signed)
Pt has noticed a rash on her face x 2 days.
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
## 2017-01-31 NOTE — Discharge Instructions (Signed)
Take prednisone for the next 4 days and antihistamine as needed to relieve itching. Avoid scented soaps and lotions.  Follow up with your primary care provider. Return if symptoms worsen in the meantime.

## 2017-02-18 ENCOUNTER — Emergency Department (HOSPITAL_COMMUNITY)
Admission: EM | Admit: 2017-02-18 | Discharge: 2017-02-18 | Disposition: A | Discharge status: Home / Self Care | Payer: Medicaid Other | Attending: Emergency Medicine | Admitting: Emergency Medicine

## 2017-02-18 ENCOUNTER — Encounter (HOSPITAL_COMMUNITY): Payer: Self-pay | Admitting: *Deleted

## 2017-02-18 DIAGNOSIS — J029 Acute pharyngitis, unspecified: Secondary | ICD-10-CM | POA: Diagnosis present

## 2017-02-18 DIAGNOSIS — Z79899 Other long term (current) drug therapy: Secondary | ICD-10-CM | POA: Diagnosis not present

## 2017-02-18 DIAGNOSIS — J020 Streptococcal pharyngitis: Secondary | ICD-10-CM | POA: Diagnosis not present

## 2017-02-18 LAB — RAPID STREP SCREEN (MED CTR MEBANE ONLY): Streptococcus, Group A Screen (Direct): POSITIVE — AB

## 2017-02-18 MED ORDER — DIPHENHYDRAMINE HCL 50 MG/ML IJ SOLN
INTRAMUSCULAR | Status: AC
  Filled 2017-02-18: qty 1

## 2017-02-18 MED ORDER — TRAMADOL HCL 50 MG PO TABS: 50.0000 mg | ORAL_TABLET | Freq: Four times a day (QID) | ORAL | 0 refills | Status: DC | PRN

## 2017-02-18 MED ORDER — DIPHENHYDRAMINE HCL 50 MG/ML IJ SOLN
25.0000 mg | Freq: Once | INTRAMUSCULAR | Status: AC
  Administered 2017-02-18: 25 mg via INTRAVENOUS

## 2017-02-18 MED ORDER — PENICILLIN V POTASSIUM 500 MG PO TABS: 500.0000 mg | ORAL_TABLET | Freq: Four times a day (QID) | ORAL | 0 refills | Status: AC

## 2017-02-18 MED ORDER — MORPHINE SULFATE (PF) 4 MG/ML IV SOLN
4.0000 mg | Freq: Once | INTRAVENOUS | Status: AC
  Administered 2017-02-18: 4 mg via INTRAVENOUS
  Filled 2017-02-18: qty 1

## 2017-02-18 MED ORDER — DEXAMETHASONE SODIUM PHOSPHATE 4 MG/ML IJ SOLN
12.0000 mg | Freq: Once | INTRAMUSCULAR | Status: AC
  Administered 2017-02-18: 12 mg via INTRAVENOUS
  Filled 2017-02-18: qty 3

## 2017-02-18 MED ORDER — ACETAMINOPHEN 325 MG PO TABS
650.0000 mg | ORAL_TABLET | Freq: Once | ORAL | Status: AC
  Administered 2017-02-18: 650 mg via ORAL
  Filled 2017-02-18: qty 2

## 2017-02-18 MED ORDER — SODIUM CHLORIDE 0.9 % IV BOLUS (SEPSIS)
1000.0000 mL | Freq: Once | INTRAVENOUS | Status: AC
  Administered 2017-02-18: 1000 mL via INTRAVENOUS

## 2017-02-18 NOTE — ED Triage Notes (Signed)
Pt reports sore throat, fever, chills, cough, and feeling like her throat is closing up since yesterday.
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
## 2017-03-18 ENCOUNTER — Emergency Department (HOSPITAL_COMMUNITY)
Admission: EM | Admit: 2017-03-18 | Discharge: 2017-03-18 | Disposition: A | Discharge status: Home / Self Care | Payer: Medicaid Other | Attending: Emergency Medicine | Admitting: Emergency Medicine

## 2017-03-18 ENCOUNTER — Emergency Department (HOSPITAL_COMMUNITY): Payer: Medicaid Other

## 2017-03-18 ENCOUNTER — Encounter (HOSPITAL_COMMUNITY): Payer: Self-pay | Admitting: Emergency Medicine

## 2017-03-18 DIAGNOSIS — M25562 Pain in left knee: Secondary | ICD-10-CM | POA: Insufficient documentation

## 2017-03-18 MED ORDER — DEXAMETHASONE 4 MG PO TABS: 4.0000 mg | ORAL_TABLET | Freq: Two times a day (BID) | ORAL | 0 refills | Status: DC

## 2017-03-18 MED ORDER — DEXAMETHASONE SODIUM PHOSPHATE 10 MG/ML IJ SOLN
10.0000 mg | Freq: Once | INTRAMUSCULAR | Status: AC
  Administered 2017-03-18: 10 mg via INTRAMUSCULAR
  Filled 2017-03-18: qty 1

## 2017-03-18 MED ORDER — ACETAMINOPHEN 500 MG PO TABS
1000.0000 mg | ORAL_TABLET | Freq: Once | ORAL | Status: AC
  Administered 2017-03-18: 1000 mg via ORAL
  Filled 2017-03-18: qty 2

## 2017-03-18 MED ORDER — TRAMADOL HCL 50 MG PO TABS: 50.0000 mg | ORAL_TABLET | Freq: Four times a day (QID) | ORAL | 0 refills | Status: DC | PRN

## 2017-03-18 NOTE — Discharge Instructions (Signed)
Your vital signs are within normal limits.  The x-ray of your knee is negative for fracture, dislocation, or fluid in the joint (effusion).  There is some thickening of one side of the tibia near your knee.  Please have Dr. Romeo AppleHarrison, or the orthopedic specialist of your choice to evaluate this.  Please use Decadron 2 times daily.  Use Tylenol extra strength for mild pain.  Use Ultram for more severe pain.This medication may cause drowsiness. Please do not drink, drive, or participate in activity that requires concentration while taking this medication.
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
## 2017-03-18 NOTE — ED Triage Notes (Signed)
Patient complains left knee pain x 3 days. States pain shoots down to ankle. States nothing alleviates pain. Weight Bering aggravates pain.

## 2017-08-01 ENCOUNTER — Emergency Department (HOSPITAL_COMMUNITY): Payer: Medicaid Other

## 2017-08-01 ENCOUNTER — Emergency Department (HOSPITAL_COMMUNITY)
Admission: EM | Admit: 2017-08-01 | Discharge: 2017-08-01 | Disposition: A | Discharge status: Home / Self Care | Payer: Medicaid Other | Attending: Emergency Medicine | Admitting: Emergency Medicine

## 2017-08-01 ENCOUNTER — Encounter (HOSPITAL_COMMUNITY): Payer: Self-pay | Admitting: *Deleted

## 2017-08-01 DIAGNOSIS — R42 Dizziness and giddiness: Secondary | ICD-10-CM | POA: Insufficient documentation

## 2017-08-01 DIAGNOSIS — Z79899 Other long term (current) drug therapy: Secondary | ICD-10-CM | POA: Diagnosis not present

## 2017-08-01 DIAGNOSIS — R1110 Vomiting, unspecified: Secondary | ICD-10-CM | POA: Diagnosis present

## 2017-08-01 DIAGNOSIS — B9789 Other viral agents as the cause of diseases classified elsewhere: Secondary | ICD-10-CM | POA: Diagnosis not present

## 2017-08-01 DIAGNOSIS — J988 Other specified respiratory disorders: Secondary | ICD-10-CM | POA: Insufficient documentation

## 2017-08-01 DIAGNOSIS — R05 Cough: Secondary | ICD-10-CM | POA: Diagnosis not present

## 2017-08-01 DIAGNOSIS — R509 Fever, unspecified: Secondary | ICD-10-CM | POA: Diagnosis not present
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
## 2017-08-01 NOTE — ED Triage Notes (Signed)
Pt with flu like symptoms for past 3 days- gen. Weakness, productive cough of yellow sputum and emesis.  Emesis x 2 today.

## 2017-08-01 NOTE — Discharge Instructions (Signed)
Take Tylenol as directed for aches.  Take Robitussin DM as directed for cough.Make sure that you drink at least six 8 ounce glasses of water or Gatorade each day in order to stay well-hydrated.  See your primary care physician if not feeling better in 4 or 5 days.  Return if concern for any reason

## 2017-10-24 ENCOUNTER — Emergency Department (HOSPITAL_COMMUNITY)
Admission: EM | Admit: 2017-10-24 | Discharge: 2017-10-25 | Disposition: A | Discharge status: Home / Self Care | Payer: Medicaid Other | Attending: Emergency Medicine | Admitting: Emergency Medicine

## 2017-10-24 ENCOUNTER — Emergency Department (HOSPITAL_COMMUNITY): Payer: Medicaid Other

## 2017-10-24 ENCOUNTER — Encounter (HOSPITAL_COMMUNITY): Payer: Self-pay | Admitting: *Deleted

## 2017-10-24 DIAGNOSIS — R197 Diarrhea, unspecified: Secondary | ICD-10-CM | POA: Diagnosis not present

## 2017-10-24 DIAGNOSIS — R509 Fever, unspecified: Secondary | ICD-10-CM | POA: Diagnosis present

## 2017-10-24 DIAGNOSIS — R1110 Vomiting, unspecified: Secondary | ICD-10-CM | POA: Insufficient documentation

## 2017-10-24 DIAGNOSIS — Z79899 Other long term (current) drug therapy: Secondary | ICD-10-CM | POA: Diagnosis not present

## 2017-10-24 DIAGNOSIS — B349 Viral infection, unspecified: Secondary | ICD-10-CM

## 2017-10-24 LAB — URINALYSIS, ROUTINE W REFLEX MICROSCOPIC
Bilirubin Urine: NEGATIVE
GLUCOSE, UA: NEGATIVE mg/dL
Ketones, ur: NEGATIVE mg/dL
Leukocytes, UA: NEGATIVE
Nitrite: NEGATIVE
Protein, ur: 30 mg/dL — AB
Specific Gravity, Urine: 1.021 (ref 1.005–1.030)
pH: 5 (ref 5.0–8.0)

## 2017-10-24 LAB — PREGNANCY, URINE: Preg Test, Ur: NEGATIVE

## 2017-10-24 MED ORDER — ACETAMINOPHEN 500 MG PO TABS
1000.0000 mg | ORAL_TABLET | Freq: Once | ORAL | Status: AC
  Administered 2017-10-24: 1000 mg via ORAL
  Filled 2017-10-24: qty 2

## 2017-10-24 MED ORDER — ONDANSETRON HCL 4 MG/2ML IJ SOLN
4.0000 mg | Freq: Once | INTRAMUSCULAR | Status: AC
  Administered 2017-10-25: 4 mg via INTRAVENOUS
  Filled 2017-10-24: qty 2

## 2017-10-24 MED ORDER — SODIUM CHLORIDE 0.9 % IV BOLUS
1000.0000 mL | Freq: Once | INTRAVENOUS | Status: AC
  Administered 2017-10-25: 1000 mL via INTRAVENOUS

## 2017-10-24 NOTE — ED Triage Notes (Signed)
Pt with fever and HA, generalized weakness starting today, denies taking anything for it.

## 2017-10-25 LAB — CBC WITH DIFFERENTIAL/PLATELET
Basophils Absolute: 0 10*3/uL (ref 0.0–0.1)
Basophils Relative: 0 %
Eosinophils Absolute: 0 10*3/uL (ref 0.0–0.7)
Eosinophils Relative: 0 %
HEMATOCRIT: 41.1 % (ref 36.0–46.0)
HEMOGLOBIN: 13.5 g/dL (ref 12.0–15.0)
LYMPHS ABS: 1.5 10*3/uL (ref 0.7–4.0)
Lymphocytes Relative: 13 %
MCH: 29.6 pg (ref 26.0–34.0)
MCHC: 32.8 g/dL (ref 30.0–36.0)
MCV: 90.1 fL (ref 78.0–100.0)
MONOS PCT: 2 %
Monocytes Absolute: 0.3 10*3/uL (ref 0.1–1.0)
NEUTROS ABS: 9.3 10*3/uL — AB (ref 1.7–7.7)
NEUTROS PCT: 85 %
Platelets: 385 10*3/uL (ref 150–400)
RBC: 4.56 MIL/uL (ref 3.87–5.11)
RDW: 13.7 % (ref 11.5–15.5)
WBC: 11 10*3/uL — ABNORMAL HIGH (ref 4.0–10.5)

## 2017-10-25 LAB — BASIC METABOLIC PANEL
Anion gap: 8 (ref 5–15)
BUN: 9 mg/dL (ref 6–20)
CO2: 24 mmol/L (ref 22–32)
CREATININE: 0.66 mg/dL (ref 0.44–1.00)
Calcium: 9.3 mg/dL (ref 8.9–10.3)
Chloride: 108 mmol/L (ref 101–111)
GFR calc Af Amer: >60 mL/min (ref >60)
GFR calc non Af Amer: >60 mL/min (ref >60)
GLUCOSE: 132 mg/dL — AB (ref 65–99)
Potassium: 3.3 mmol/L — ABNORMAL LOW (ref 3.5–5.1)
Sodium: 140 mmol/L (ref 135–145)

## 2017-10-25 NOTE — Discharge Instructions (Addendum)
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
## 2017-12-14 IMAGING — DX DG KNEE COMPLETE 4+V*L*
4 series · 4 of 4 positions shown · non-contrast
Comparison: 04/02/2014

CLINICAL DATA: Knee pain for several days, no known injury, initial
encounter

EXAM:
LEFT KNEE - COMPLETE 4+ VIEW

[knee ap]
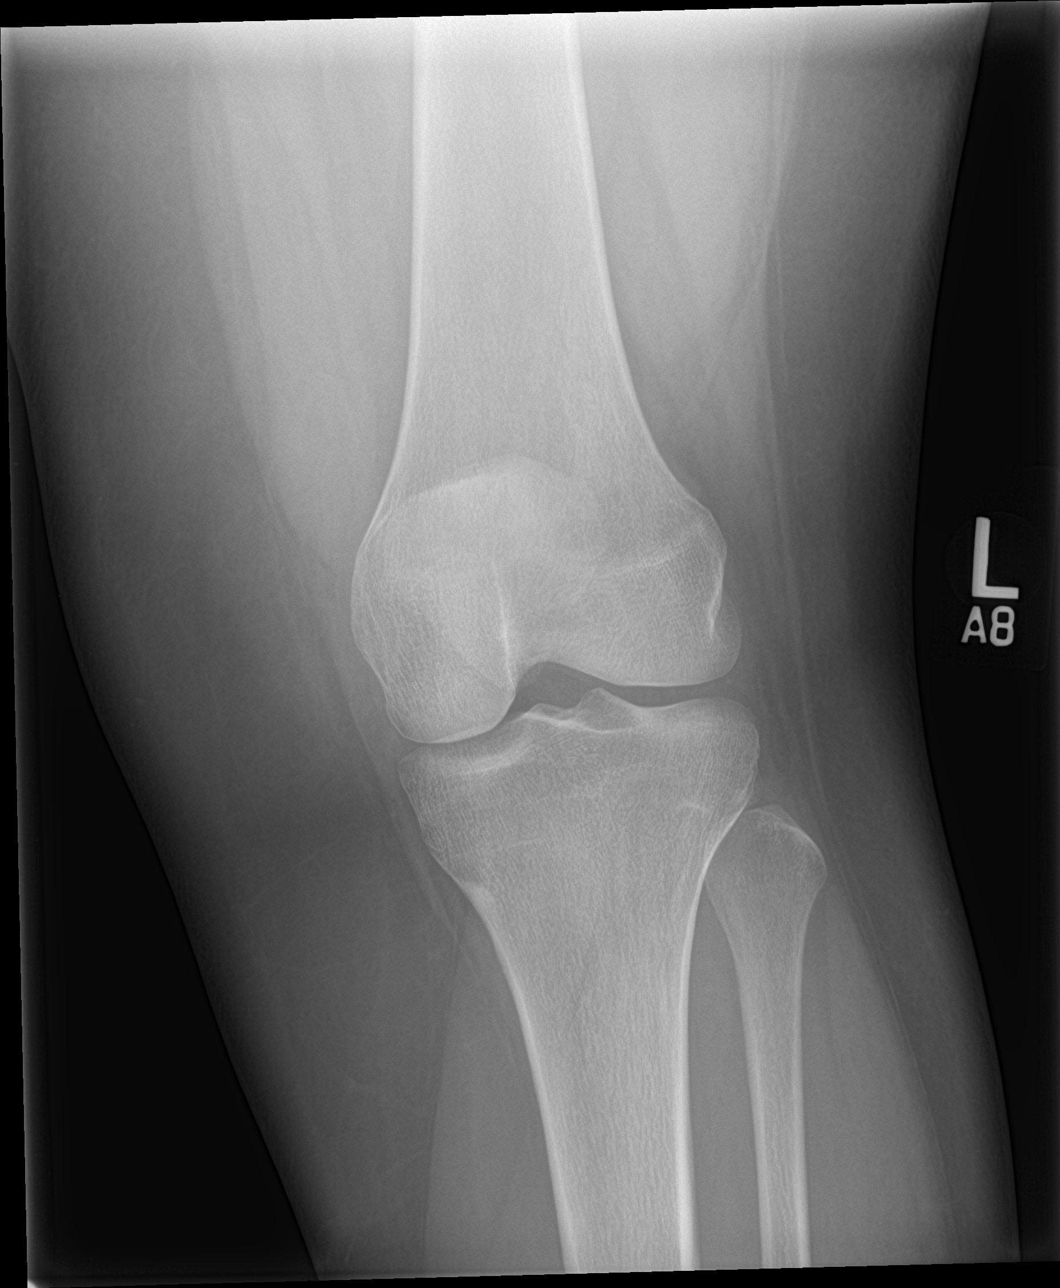

[tunnel]
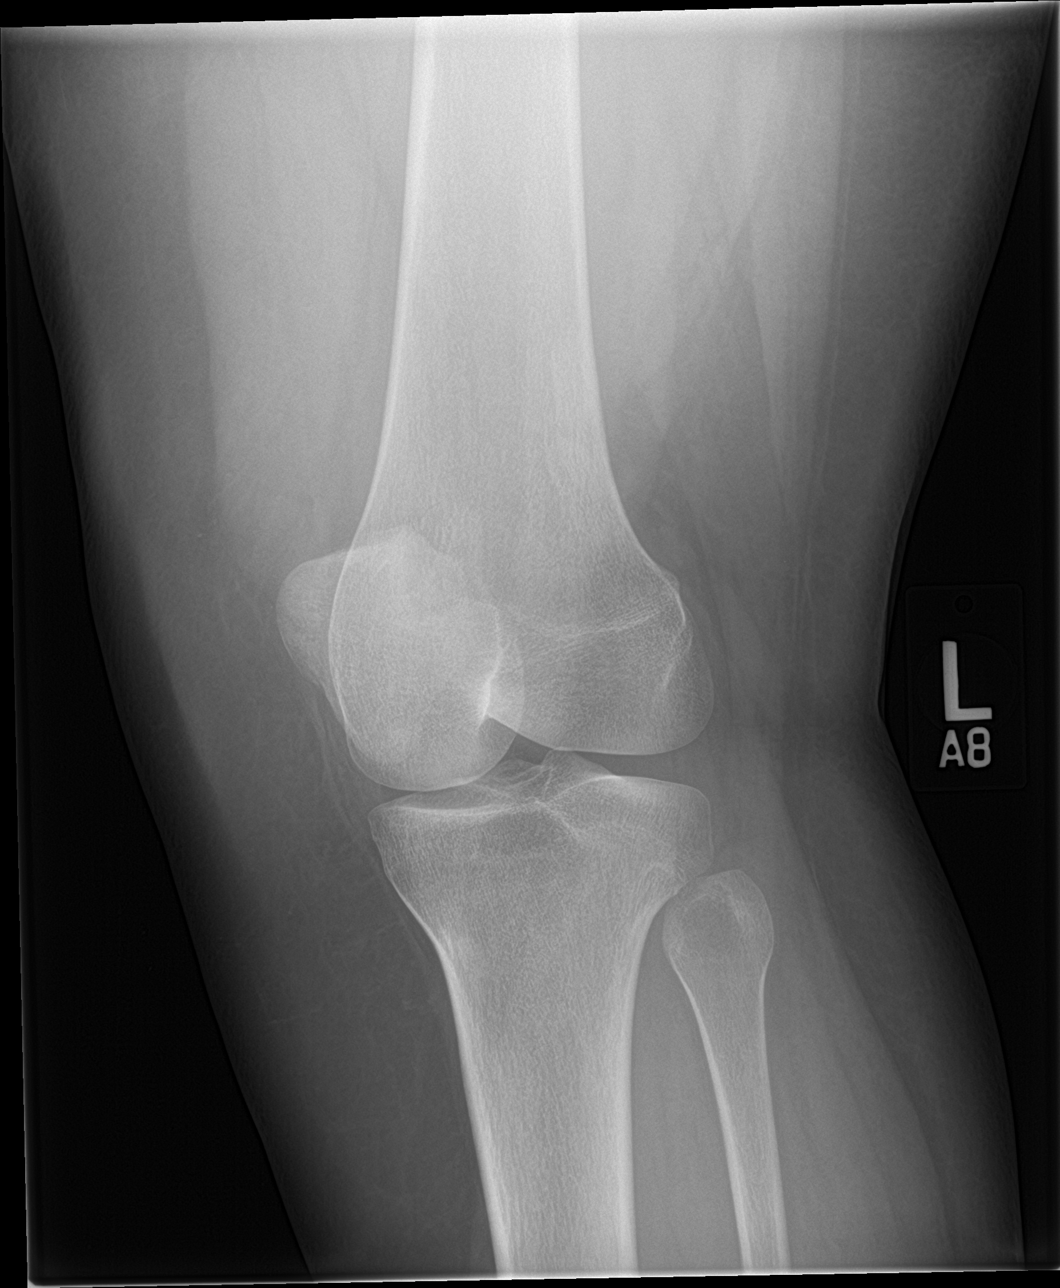

[knee lat]
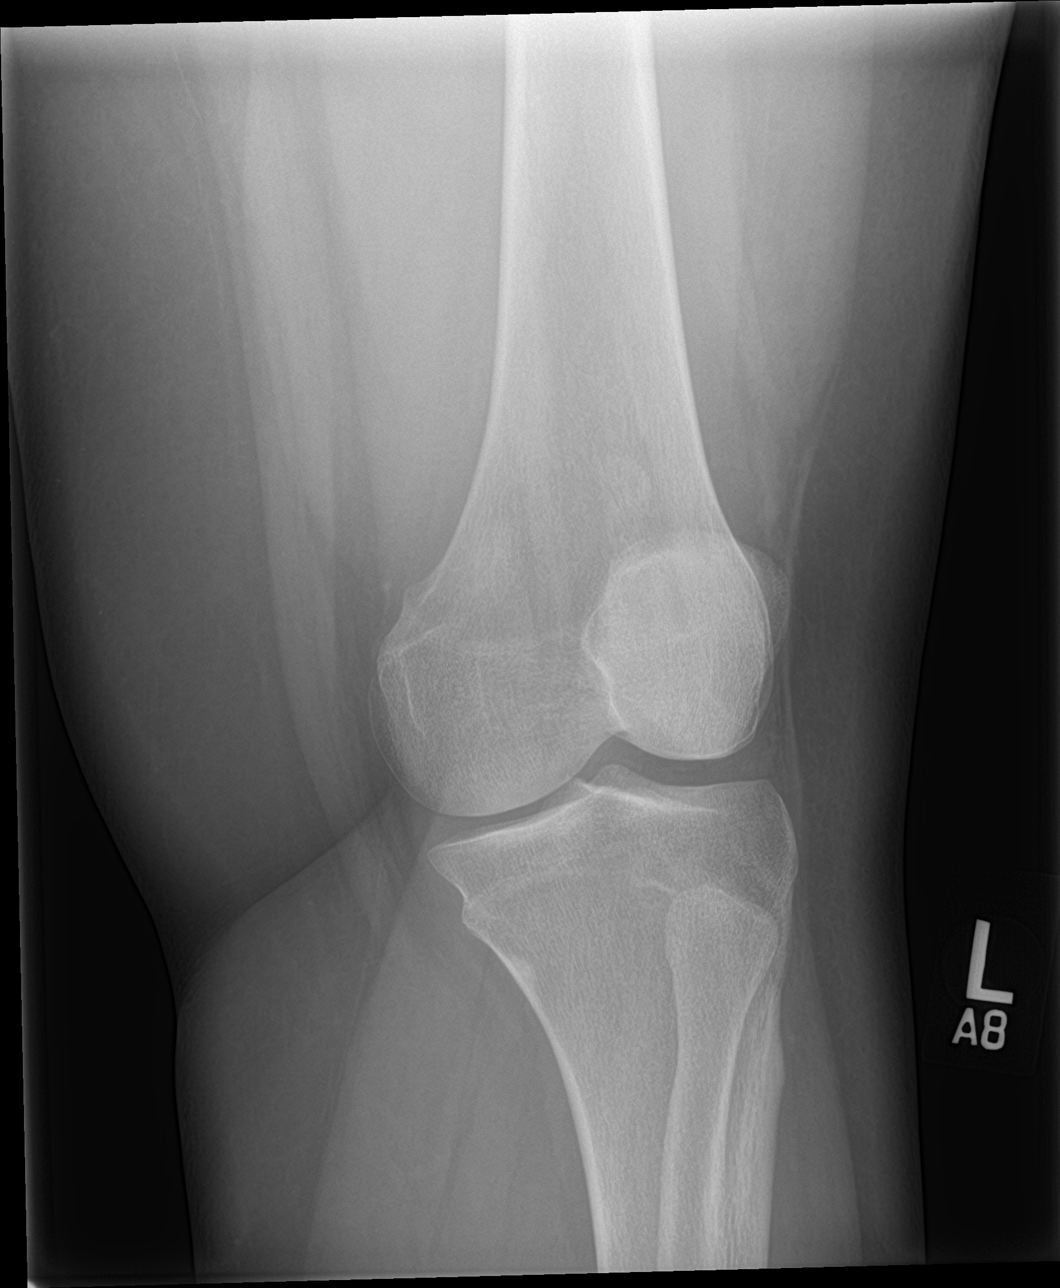

[knee sunrise]
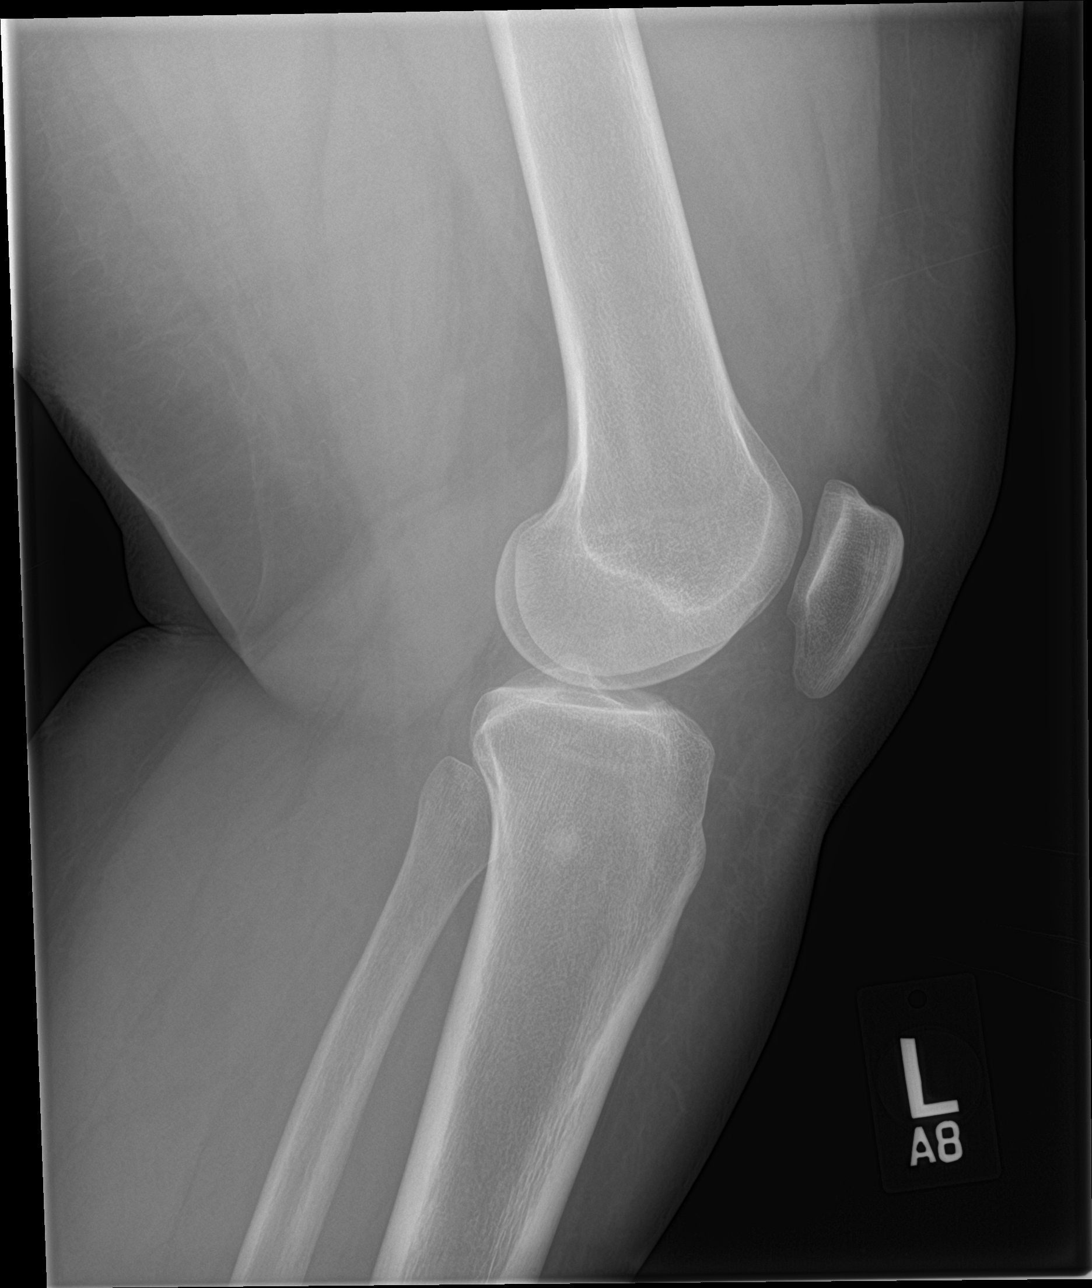

[4 of 4 positions shown; findings below may reference images not displayed]

FINDINGS: No acute fracture or dislocation is noted. Some mild cortical
thickening is noted along the medial aspect of the proximal tibial
metaphysis which has an overall benign appearance but is slightly
more prominent than that seen on prior exam. No other focal bony
abnormality is noted. No soft tissue changes are seen.
IMPRESSION: Mild cortical thickening in the proximal tibial metaphysis likely
developmental in nature. With clinical symptomatology persists
further imaging may be helpful.

## 2018-03-12 ENCOUNTER — Other Ambulatory Visit: Payer: Self-pay

## 2018-03-12 ENCOUNTER — Emergency Department
Admission: EM | Admit: 2018-03-12 | Discharge: 2018-03-12 | Disposition: A | Discharge status: Home / Self Care | Payer: Medicaid Other | Attending: Emergency Medicine | Admitting: Emergency Medicine

## 2018-03-12 ENCOUNTER — Encounter: Payer: Self-pay | Admitting: Emergency Medicine

## 2018-03-12 DIAGNOSIS — R1030 Lower abdominal pain, unspecified: Secondary | ICD-10-CM | POA: Diagnosis present

## 2018-03-12 DIAGNOSIS — Z79899 Other long term (current) drug therapy: Secondary | ICD-10-CM | POA: Diagnosis not present

## 2018-03-12 DIAGNOSIS — Z9049 Acquired absence of other specified parts of digestive tract: Secondary | ICD-10-CM | POA: Diagnosis not present

## 2018-03-12 LAB — CBC
HEMATOCRIT: 39.2 % (ref 36.0–46.0)
HEMOGLOBIN: 13 g/dL (ref 12.0–15.0)
MCH: 29.9 pg (ref 26.0–34.0)
MCHC: 33.2 g/dL (ref 30.0–36.0)
MCV: 90.1 fL (ref 80.0–100.0)
Platelets: 426 10*3/uL — ABNORMAL HIGH (ref 150–400)
RBC: 4.35 MIL/uL (ref 3.87–5.11)
RDW: 12.9 % (ref 11.5–15.5)
WBC: 10.7 10*3/uL — ABNORMAL HIGH (ref 4.0–10.5)
nRBC: 0 % (ref 0.0–0.2)

## 2018-03-12 LAB — COMPREHENSIVE METABOLIC PANEL
ALBUMIN: 4.3 g/dL (ref 3.5–5.0)
ALT: 28 U/L (ref 0–44)
ANION GAP: 9 (ref 5–15)
AST: 28 U/L (ref 15–41)
Alkaline Phosphatase: 58 U/L (ref 38–126)
BILIRUBIN TOTAL: 0.5 mg/dL (ref 0.3–1.2)
BUN: 9 mg/dL (ref 6–20)
CHLORIDE: 104 mmol/L (ref 98–111)
CO2: 23 mmol/L (ref 22–32)
Calcium: 9.8 mg/dL (ref 8.9–10.3)
Creatinine, Ser: 0.65 mg/dL (ref 0.44–1.00)
GFR calc Af Amer: >60 mL/min (ref >60)
GFR calc non Af Amer: >60 mL/min (ref >60)
Glucose, Bld: 108 mg/dL — ABNORMAL HIGH (ref 70–99)
POTASSIUM: 3.6 mmol/L (ref 3.5–5.1)
SODIUM: 136 mmol/L (ref 135–145)
TOTAL PROTEIN: 8.7 g/dL — AB (ref 6.5–8.1)

## 2018-03-12 LAB — URINALYSIS, COMPLETE (UACMP) WITH MICROSCOPIC
BILIRUBIN URINE: NEGATIVE
GLUCOSE, UA: NEGATIVE mg/dL
Hgb urine dipstick: NEGATIVE
Ketones, ur: NEGATIVE mg/dL
Leukocytes, UA: NEGATIVE
Nitrite: NEGATIVE
Protein, ur: NEGATIVE mg/dL
SPECIFIC GRAVITY, URINE: 1.02 (ref 1.005–1.030)
pH: 8.5 — ABNORMAL HIGH (ref 5.0–8.0)

## 2018-03-12 LAB — POCT PREGNANCY, URINE: Preg Test, Ur: NEGATIVE

## 2018-03-12 LAB — LIPASE, BLOOD: LIPASE: 35 U/L (ref 11–51)

## 2018-03-12 MED ORDER — NITROFURANTOIN MACROCRYSTAL 100 MG PO CAPS: 100.0000 mg | ORAL_CAPSULE | Freq: Two times a day (BID) | ORAL | 0 refills | Status: DC
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
## 2018-03-12 NOTE — ED Triage Notes (Signed)
Pt c/o lower abdominal/pelvic pain. Denies NVD, fevers.  Last period in September, has not had this month but reports irregular since implant taken out in June this year.  Unsure if pregnant. Denies vaginal discharge or urinary sx.

## 2018-03-12 NOTE — ED Notes (Signed)
Blood and urine clicked off on wrong patient. Blood and urine was NOT collected by this RN for this patient.

## 2018-06-02 ENCOUNTER — Encounter: Payer: Self-pay | Admitting: Emergency Medicine

## 2018-06-02 ENCOUNTER — Emergency Department
Admission: EM | Admit: 2018-06-02 | Discharge: 2018-06-02 | Disposition: A | Discharge status: Home / Self Care | Payer: Medicaid Other | Attending: Emergency Medicine | Admitting: Emergency Medicine

## 2018-06-02 DIAGNOSIS — Y999 Unspecified external cause status: Secondary | ICD-10-CM | POA: Insufficient documentation

## 2018-06-02 DIAGNOSIS — Y929 Unspecified place or not applicable: Secondary | ICD-10-CM | POA: Insufficient documentation

## 2018-06-02 DIAGNOSIS — M79602 Pain in left arm: Secondary | ICD-10-CM | POA: Diagnosis present

## 2018-06-02 DIAGNOSIS — M7918 Myalgia, other site: Secondary | ICD-10-CM

## 2018-06-02 DIAGNOSIS — Y9389 Activity, other specified: Secondary | ICD-10-CM | POA: Insufficient documentation

## 2018-06-02 DIAGNOSIS — M79605 Pain in left leg: Secondary | ICD-10-CM | POA: Insufficient documentation

## 2018-06-02 MED ORDER — CYCLOBENZAPRINE HCL 10 MG PO TABS
10.0000 mg | ORAL_TABLET | Freq: Once | ORAL | Status: AC
  Administered 2018-06-02: 10 mg via ORAL
  Filled 2018-06-02: qty 1

## 2018-06-02 MED ORDER — OXYCODONE-ACETAMINOPHEN 5-325 MG PO TABS
1.0000 | ORAL_TABLET | Freq: Once | ORAL | Status: AC
  Administered 2018-06-02: 1 via ORAL
  Filled 2018-06-02: qty 1

## 2018-06-02 MED ORDER — CYCLOBENZAPRINE HCL 10 MG PO TABS: 10.0000 mg | ORAL_TABLET | Freq: Three times a day (TID) | ORAL | 0 refills | Status: DC | PRN

## 2018-06-02 MED ORDER — OXYCODONE-ACETAMINOPHEN 7.5-325 MG PO TABS: 1.0000 | ORAL_TABLET | Freq: Four times a day (QID) | ORAL | 0 refills | Status: DC | PRN
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
## 2019-01-30 ENCOUNTER — Other Ambulatory Visit: Payer: Self-pay

## 2019-01-30 ENCOUNTER — Emergency Department
Admission: EM | Admit: 2019-01-30 | Discharge: 2019-01-30 | Disposition: A | Discharge status: Home / Self Care | Payer: Medicaid Other | Attending: Emergency Medicine | Admitting: Emergency Medicine

## 2019-01-30 DIAGNOSIS — L739 Follicular disorder, unspecified: Secondary | ICD-10-CM

## 2019-01-30 DIAGNOSIS — R591 Generalized enlarged lymph nodes: Secondary | ICD-10-CM | POA: Diagnosis present

## 2019-01-30 DIAGNOSIS — Z79899 Other long term (current) drug therapy: Secondary | ICD-10-CM | POA: Diagnosis not present

## 2019-01-30 MED ORDER — SULFAMETHOXAZOLE-TRIMETHOPRIM 800-160 MG PO TABS: 1.0000 | ORAL_TABLET | Freq: Two times a day (BID) | ORAL | 0 refills | Status: DC

## 2019-01-30 MED ORDER — PREDNISONE 10 MG (21) PO TBPK: ORAL_TABLET | ORAL | 0 refills | Status: DC

## 2019-01-30 NOTE — ED Notes (Signed)
See triage note  Presents with possible insect bite to back of neck  Area is red and swollen and also very tender to touch  No fever  States she was seen yesterday at Urgent care  Started on amoxil  But having increased pain today
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
## 2019-01-30 NOTE — Discharge Instructions (Signed)
Stop taking the Amoxicillin. Take the Bactrim until finished.  You may take tylenol in addition to the prescribed medication.

## 2019-01-30 NOTE — ED Triage Notes (Signed)
Pt was at urgent care yesterday for "spider bite" behind right ear.  Came today for headache and lymphadenopathy. Pt reports urgent care did not even look at area.  Has not been on abx for 24 hrs yet.  Pt has area that looks like infected hair follicle type area and swelling of lymph nodes distal.  No fever.  No systemic sx.  Has not taken anything for pain. Here today because of headache from pain of lymph swelling.

## 2019-06-05 ENCOUNTER — Ambulatory Visit: Payer: Medicaid Other | Attending: Internal Medicine

## 2019-10-12 ENCOUNTER — Ambulatory Visit: Payer: Medicaid Other | Attending: Internal Medicine

## 2019-10-12 DIAGNOSIS — Z23 Encounter for immunization: Secondary | ICD-10-CM

## 2019-10-12 NOTE — Progress Notes (Signed)
   Covid-19 Vaccination Clinic  Name:  Katherine Downs    MRN: [id number] DOB: April 13, 1996  10/12/2019  Katherine Downs was observed post Covid-19 immunization for 15 minutes without incident. She was provided with Vaccine Information Sheet and instruction to access the V-Safe system.   Katherine Downs was instructed to call 911 with any severe reactions post vaccine: Marland Kitchen Difficulty breathing  . Swelling of face and throat  . A fast heartbeat  . A bad rash all over body  . Dizziness and weakness   Immunizations Administered    Name Date Dose VIS Date Route   Pfizer COVID-19 Vaccine 10/12/2019  5:18 PM 0.3 mL 07/19/2018 Intramuscular   Manufacturer: [employer], [employer]   Lot: [id number]   NDC: [address]-9

## 2019-11-02 ENCOUNTER — Ambulatory Visit: Payer: Medicaid Other | Attending: Internal Medicine

## 2019-11-02 DIAGNOSIS — Z23 Encounter for immunization: Secondary | ICD-10-CM

## 2019-11-02 NOTE — Progress Notes (Signed)
° °  Covid-19 Vaccination Clinic  Name:  KIANNAH GRUNOW    MRN: [id number] DOB: 04/15/1996  11/02/2019  Ms. Carano was observed post Covid-19 immunization for 15 minutes without incident. She was provided with Vaccine Information Sheet and instruction to access the V-Safe system.   Ms. Garron was instructed to call 911 with any severe reactions post vaccine:  Difficulty breathing   Swelling of face and throat   A fast heartbeat   A bad rash all over body   Dizziness and weakness   Immunizations Administered    Name Date Dose VIS Date Route   Pfizer COVID-19 Vaccine 11/02/2019  4:46 PM 0.3 mL 07/19/2018 Intramuscular   Manufacturer: [employer], [employer]   Lot: [id number]   NDC: [address]-0

## 2019-12-18 ENCOUNTER — Other Ambulatory Visit: Payer: Self-pay

## 2019-12-18 ENCOUNTER — Ambulatory Visit
Admission: EM | Admit: 2019-12-18 | Discharge: 2019-12-18 | Disposition: A | Discharge status: Home / Self Care | Payer: Medicaid Other

## 2019-12-18 ENCOUNTER — Encounter: Payer: Self-pay | Admitting: Emergency Medicine

## 2019-12-18 DIAGNOSIS — R519 Headache, unspecified: Secondary | ICD-10-CM | POA: Diagnosis not present

## 2019-12-18 NOTE — Discharge Instructions (Addendum)
I would have you take Tylenol 1000mg  and Benadryl 50mg  tonight for your headache.  I would also have you log your blood pressures at home. Once daily in the morning, then check it when you have a headache to see if it is changing  If both of these are ineffective, follow up with this office or with primary care to possibly start a triptan for headaches  Follow up with the ER for vision changes, the worst headache of your life, other concerning symptoms

## 2019-12-18 NOTE — ED Triage Notes (Addendum)
Headaches on and off x 1 week, has tried tylenol and Excedrin with no relief. Pt has hx of headaches.  Denies any head injuries.

## 2019-12-18 NOTE — ED Provider Notes (Signed)
[facility]   [id number] 12/18/19 Arrival Time: 1722  CC: HEADACHE  SUBJECTIVE:  Katherine Downs is a 24 y.o. female who complains of migraine for the last week. Has hx of headaches.  Denies a precipitating event, or recent head trauma. Describes the pain as constant and throbbing in character. Patient has tried OTC tylenol, and excedrin migraine without relief. Symptoms are made worse with bright light and loud noises. Reports similar symptoms in the past that improved with OTC medications. This is not the worst headache of their life. Patient denies fever, chills, nausea, vomiting, aura, rhinorrhea, watery eyes, chest pain, SOB, abdominal pain, weakness, numbness or tingling, slurred speech.     ROS: As per HPI.  All other pertinent ROS negative.     Past Medical History:  Diagnosis Date  . GERD (gastroesophageal reflux disease)    NO MEDS  . Headache    MIGRAINES  . Iron (Fe) deficiency anemia    Past Surgical History:  Procedure Laterality Date  . CESAREAN SECTION    . CHOLECYSTECTOMY N/A 05/11/2016   Procedure: LAPAROSCOPIC CHOLECYSTECTOMY WITH INTRAOPERATIVE CHOLANGIOGRAM;  Surgeon: Ricarda Frame, MD;  Location: [facility] ORS;  Service: General;  Laterality: N/A;   Allergies  Allergen Reactions  . Ibuprofen Hives   No current facility-administered medications on file prior to encounter.   Current Outpatient Medications on File Prior to Encounter  Medication Sig Dispense Refill  . etonogestrel (NEXPLANON) 68 MG IMPL implant 1 each by Subdermal route once.    . predniSONE (STERAPRED UNI-PAK 21 TAB) 10 MG (21) TBPK tablet Take 6 tablets on the first day and decrease by 1 tablet each day until finished. 21 tablet 0  . sulfamethoxazole-trimethoprim (BACTRIM DS) 800-160 MG tablet Take 1 tablet by mouth 2 (two) times daily. 20 tablet 0   Social History   Socioeconomic History  . Marital status: Single    Spouse name: Not on file  . Number of children: Not on file    . Years of education: Not on file  . Highest education level: Not on file  Occupational History  . Not on file  Tobacco Use  . Smoking status: Never Smoker  . Smokeless tobacco: Never Used  Vaping Use  . Vaping Use: Never used  Substance and Sexual Activity  . Alcohol use: Yes    Comment: ocassional  . Drug use: No  . Sexual activity: Yes    Birth control/protection: None  Other Topics Concern  . Not on file  Social History Narrative  . Not on file   Social Determinants of Health   Financial Resource Strain:   . Difficulty of Paying Living Expenses:   Food Insecurity:   . Worried About Programme researcher, broadcasting/film/video in the Last Year:   . Barista in the Last Year:   Transportation Needs:   . Freight forwarder (Medical):   Marland Kitchen Lack of Transportation (Non-Medical):   Physical Activity:   . Days of Exercise per Week:   . Minutes of Exercise per Session:   Stress:   . Feeling of Stress :   Social Connections:   . Frequency of Communication with Friends and Family:   . Frequency of Social Gatherings with Friends and Family:   . Attends Religious Services:   . Active Member of Clubs or Organizations:   . Attends Banker Meetings:   Marland Kitchen Marital Status:   Intimate Partner Violence:   . Fear of Current or Ex-Partner:   .  Emotionally Abused:   Marland Kitchen Physically Abused:   . Sexually Abused:    Family History  Problem Relation Age of Onset  . Healthy Mother   . Healthy Father     OBJECTIVE:  Vitals:   12/18/19 1746 12/18/19 1749  BP:  119/85  Pulse:  97  Resp:  19  Temp:  98.6 F (37 C)  TempSrc:  Oral  SpO2:  96%  Weight: (!) 252 lb (114.3 kg)   Height: 5\' 4"  (1.626 m)     General appearance: alert; no distress, wearing sunglasses Eyes: PERRLA; EOMI HENT: normocephalic; atraumatic Neck: supple with FROM Lungs: clear to auscultation bilaterally Heart: regular rate and rhythm.  Radial pulses 2+ symmetrical bilaterally Extremities: no edema;  symmetrical with no gross deformities Skin: warm and dry Neurologic: CN 2-12 grossly intact; finger to nose without difficulty; normal gait; strength and sensation intact bilaterally about the upper and lower extremities; negative pronator drift Psychological: alert and cooperative; normal mood and affect   ASSESSMENT & PLAN:  1. Nonintractable headache, unspecified chronicity pattern, unspecified headache type    Take tylenol 1000mg  and benadryl when you get home Log your blood pressure values to see if your headachesa re correlating with changes in blood pressure If these are not effective and there is no blood pressure correlation, follow up with primary care or this office to possibly start triptans for migraines Rest and drink plenty of fluids Return or go to the ER if you have any new or worsening symptoms such as fever, chills, nausea, vomiting, chest pain, shortness of breath, cough, vision changes, worsening headache despite treatment, slurred speech, facial asymmetry, weakness in arms or legs.  Reviewed expectations re: course of current medical issues. Questions answered. Outlined signs and symptoms indicating need for more acute intervention. Patient verbalized understanding. After Visit Summary given.   , NP 12/25/19 [phone]

## 2020-01-01 ENCOUNTER — Ambulatory Visit: Payer: Self-pay

## 2020-06-05 ENCOUNTER — Encounter: Payer: Self-pay | Admitting: Emergency Medicine

## 2020-06-05 ENCOUNTER — Emergency Department
Admission: EM | Admit: 2020-06-05 | Discharge: 2020-06-05 | Disposition: A | Discharge status: Home / Self Care | Payer: Medicaid Other | Attending: Emergency Medicine | Admitting: Emergency Medicine

## 2020-06-05 ENCOUNTER — Other Ambulatory Visit: Payer: Self-pay

## 2020-06-05 ENCOUNTER — Emergency Department: Payer: Medicaid Other

## 2020-06-05 DIAGNOSIS — J40 Bronchitis, not specified as acute or chronic: Secondary | ICD-10-CM | POA: Diagnosis not present

## 2020-06-05 DIAGNOSIS — R062 Wheezing: Secondary | ICD-10-CM | POA: Diagnosis present

## 2020-06-05 DIAGNOSIS — J45909 Unspecified asthma, uncomplicated: Secondary | ICD-10-CM | POA: Diagnosis not present

## 2020-06-05 DIAGNOSIS — Z20822 Contact with and (suspected) exposure to covid-19: Secondary | ICD-10-CM | POA: Diagnosis not present

## 2020-06-05 HISTORY — DX: Unspecified asthma, uncomplicated: J45.909

## 2020-06-05 LAB — SARS CORONAVIRUS 2 (TAT 6-24 HRS): SARS Coronavirus 2: NEGATIVE

## 2020-06-05 MED ORDER — ALBUTEROL SULFATE HFA 108 (90 BASE) MCG/ACT IN AERS: 2.0000 | INHALATION_SPRAY | RESPIRATORY_TRACT | 0 refills | Status: DC | PRN

## 2020-06-05 MED ORDER — PREDNISONE 10 MG (21) PO TBPK: ORAL_TABLET | ORAL | 0 refills | Status: DC
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
## 2020-06-05 NOTE — ED Triage Notes (Signed)
C/O cough and wheezing x 3 weeks.  States seen through ED in [place] 3 weeks ago and diagnosed with Asthma.  States no other medications given and has not followed up with PCP.  AAOx3.  Skin warm and dry. NAD

## 2020-06-05 NOTE — Discharge Instructions (Addendum)
Your chest x-ray does not show any concern for pneumonia.  Take the prednisone as prescribed and until finished. You should also use your inhaler if you are wheezing or short of breath.  Follow up with primary care for symptoms that are not improving over the next few days.  Your COVID test will be back in about 24 hours. You can find the results in your My Chart account.

## 2021-02-18 ENCOUNTER — Emergency Department
Admission: EM | Admit: 2021-02-18 | Discharge: 2021-02-18 | Disposition: A | Discharge status: Home / Self Care | Payer: Medicaid Other | Attending: Emergency Medicine | Admitting: Emergency Medicine

## 2021-02-18 ENCOUNTER — Other Ambulatory Visit: Payer: Self-pay

## 2021-02-18 ENCOUNTER — Encounter: Payer: Self-pay | Admitting: Emergency Medicine

## 2021-02-18 ENCOUNTER — Emergency Department: Payer: Medicaid Other

## 2021-02-18 DIAGNOSIS — Z20822 Contact with and (suspected) exposure to covid-19: Secondary | ICD-10-CM | POA: Diagnosis not present

## 2021-02-18 DIAGNOSIS — R0789 Other chest pain: Secondary | ICD-10-CM | POA: Insufficient documentation

## 2021-02-18 DIAGNOSIS — J020 Streptococcal pharyngitis: Secondary | ICD-10-CM | POA: Diagnosis not present

## 2021-02-18 DIAGNOSIS — R059 Cough, unspecified: Secondary | ICD-10-CM | POA: Insufficient documentation

## 2021-02-18 DIAGNOSIS — J029 Acute pharyngitis, unspecified: Secondary | ICD-10-CM | POA: Diagnosis present

## 2021-02-18 LAB — GROUP A STREP BY PCR: Group A Strep by PCR: DETECTED — AB

## 2021-02-18 LAB — RESP PANEL BY RT-PCR (FLU A&B, COVID) ARPGX2
Influenza A by PCR: NEGATIVE
Influenza B by PCR: NEGATIVE
SARS Coronavirus 2 by RT PCR: NEGATIVE

## 2021-02-18 MED ORDER — ALBUTEROL SULFATE HFA 108 (90 BASE) MCG/ACT IN AERS: 2.0000 | INHALATION_SPRAY | Freq: Four times a day (QID) | RESPIRATORY_TRACT | 2 refills | Status: DC | PRN

## 2021-02-18 MED ORDER — LIDOCAINE VISCOUS HCL 2 % MT SOLN: 15.0000 mL | OROMUCOSAL | 0 refills | Status: DC | PRN

## 2021-02-18 MED ORDER — AMOXICILLIN 500 MG PO CAPS: 500.0000 mg | ORAL_CAPSULE | Freq: Two times a day (BID) | ORAL | 0 refills | Status: AC

## 2021-02-18 NOTE — ED Provider Notes (Signed)
Emergency Medicine Provider Triage Evaluation Note  Katherine Downs , a 25 y.o. female  was evaluated in triage.  Pt complains of presents with sore throat and chest pain.  Exposed to COVID at her work..  Review of Systems  Positive: Sore throat, chest pain, cough, feels like she cannot breathe Negative: No fever or chills.  No vomiting or diarrhea.    Physical Exam  BP (!) 126/92   Pulse 98   Temp 98.5 F (36.9 C) (Oral)   Resp 15   Ht 5\' 3"  (1.6 m)   Wt 109.8 kg   SpO2 98%   BMI 42.87 kg/m  Gen:   Awake, no distress   Resp:  Normal effort  MSK:   Moves extremities without difficulty  Other:    Medical Decision Making  Medically screening exam initiated at 9:02 AM.  Appropriate orders placed.  Katherine Downs was informed that the remainder of the evaluation will be completed by another provider, this initial triage assessment does not replace that evaluation, and the importance of remaining in the ED until their evaluation is complete.  Patient appears stable and does not appear to be a cardiac patient.  Patient states "I really do not think I am having a heart attack.  I feel like it is more a upper respiratory infection.  "   , PA-C 02/18/21 02/20/21    [id number], MD 02/18/21 1535

## 2021-02-18 NOTE — ED Triage Notes (Signed)
Pt to ED via POV c/o sore throat, congestion, shortness of breath, chest pain, fever and chills. Pt states that her symptoms started on Saturday. Pt works in a daycare, COVID in Audiological scientist. Pt is in NAD. At home COVID test neg.

## 2021-02-18 NOTE — ED Notes (Signed)
Starting Saturday sore throat, pain in upper chest.  Says she has asthma and is out of inhaler.  Says hurts to swollow.  No distress.  She has had a cough.
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
## 2021-04-04 ENCOUNTER — Emergency Department
Admission: EM | Admit: 2021-04-04 | Discharge: 2021-04-04 | Disposition: A | Discharge status: Home / Self Care | Payer: Medicaid Other | Attending: Emergency Medicine | Admitting: Emergency Medicine

## 2021-04-04 ENCOUNTER — Emergency Department: Payer: Medicaid Other

## 2021-04-04 ENCOUNTER — Other Ambulatory Visit: Payer: Self-pay

## 2021-04-04 DIAGNOSIS — S0990XA Unspecified injury of head, initial encounter: Secondary | ICD-10-CM | POA: Diagnosis not present

## 2021-04-04 DIAGNOSIS — M25512 Pain in left shoulder: Secondary | ICD-10-CM | POA: Diagnosis not present

## 2021-04-04 DIAGNOSIS — Z7951 Long term (current) use of inhaled steroids: Secondary | ICD-10-CM | POA: Insufficient documentation

## 2021-04-04 DIAGNOSIS — S161XXA Strain of muscle, fascia and tendon at neck level, initial encounter: Secondary | ICD-10-CM | POA: Diagnosis not present

## 2021-04-04 DIAGNOSIS — Y92410 Unspecified street and highway as the place of occurrence of the external cause: Secondary | ICD-10-CM | POA: Diagnosis not present

## 2021-04-04 DIAGNOSIS — S199XXA Unspecified injury of neck, initial encounter: Secondary | ICD-10-CM | POA: Diagnosis present

## 2021-04-04 DIAGNOSIS — J45909 Unspecified asthma, uncomplicated: Secondary | ICD-10-CM | POA: Insufficient documentation

## 2021-04-04 MED ORDER — METHOCARBAMOL 500 MG PO TABS: 500.0000 mg | ORAL_TABLET | Freq: Four times a day (QID) | ORAL | 0 refills | Status: DC
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
## 2021-04-09 ENCOUNTER — Ambulatory Visit: Payer: Medicaid Other

## 2021-06-28 ENCOUNTER — Encounter: Payer: Self-pay | Admitting: Intensive Care

## 2021-06-28 ENCOUNTER — Other Ambulatory Visit: Payer: Self-pay

## 2021-06-28 ENCOUNTER — Emergency Department
Admission: EM | Admit: 2021-06-28 | Discharge: 2021-06-28 | Disposition: A | Discharge status: Home / Self Care | Payer: Medicaid Other | Attending: Emergency Medicine | Admitting: Emergency Medicine

## 2021-06-28 DIAGNOSIS — Z7984 Long term (current) use of oral hypoglycemic drugs: Secondary | ICD-10-CM | POA: Insufficient documentation

## 2021-06-28 DIAGNOSIS — O26891 Other specified pregnancy related conditions, first trimester: Secondary | ICD-10-CM | POA: Diagnosis present

## 2021-06-28 DIAGNOSIS — O24911 Unspecified diabetes mellitus in pregnancy, first trimester: Secondary | ICD-10-CM | POA: Diagnosis not present

## 2021-06-28 DIAGNOSIS — E119 Type 2 diabetes mellitus without complications: Secondary | ICD-10-CM | POA: Insufficient documentation

## 2021-06-28 DIAGNOSIS — R110 Nausea: Secondary | ICD-10-CM | POA: Diagnosis not present

## 2021-06-28 DIAGNOSIS — Z3A01 Less than 8 weeks gestation of pregnancy: Secondary | ICD-10-CM | POA: Diagnosis not present

## 2021-06-28 LAB — URINALYSIS, ROUTINE W REFLEX MICROSCOPIC
Bilirubin Urine: NEGATIVE
Glucose, UA: NEGATIVE mg/dL
Hgb urine dipstick: NEGATIVE
Ketones, ur: NEGATIVE mg/dL
Nitrite: NEGATIVE
Protein, ur: 30 mg/dL — AB
Specific Gravity, Urine: 1.025 (ref 1.005–1.030)
pH: 6 (ref 5.0–8.0)

## 2021-06-28 LAB — COMPREHENSIVE METABOLIC PANEL
ALT: 30 U/L (ref 0–44)
AST: 20 U/L (ref 15–41)
Albumin: 4.2 g/dL (ref 3.5–5.0)
Alkaline Phosphatase: 55 U/L (ref 38–126)
Anion gap: 7 (ref 5–15)
BUN: 7 mg/dL (ref 6–20)
CO2: 23 mmol/L (ref 22–32)
Calcium: 9.1 mg/dL (ref 8.9–10.3)
Chloride: 102 mmol/L (ref 98–111)
Creatinine, Ser: 0.47 mg/dL (ref 0.44–1.00)
GFR, Estimated: >60 mL/min (ref >60)
Glucose, Bld: 228 mg/dL — ABNORMAL HIGH (ref 70–99)
Potassium: 3.7 mmol/L (ref 3.5–5.1)
Sodium: 132 mmol/L — ABNORMAL LOW (ref 135–145)
Total Bilirubin: 0.7 mg/dL (ref 0.3–1.2)
Total Protein: 8.3 g/dL — ABNORMAL HIGH (ref 6.5–8.1)

## 2021-06-28 LAB — CBC
HCT: 38 % (ref 36.0–46.0)
Hemoglobin: 12.6 g/dL (ref 12.0–15.0)
MCH: 28.4 pg (ref 26.0–34.0)
MCHC: 33.2 g/dL (ref 30.0–36.0)
MCV: 85.8 fL (ref 80.0–100.0)
Platelets: 421 10*3/uL — ABNORMAL HIGH (ref 150–400)
RBC: 4.43 MIL/uL (ref 3.87–5.11)
RDW: 13.8 % (ref 11.5–15.5)
WBC: 10 10*3/uL (ref 4.0–10.5)
nRBC: 0 % (ref 0.0–0.2)

## 2021-06-28 LAB — POC URINE PREG, ED: Preg Test, Ur: NEGATIVE

## 2021-06-28 LAB — HCG, QUANTITATIVE, PREGNANCY: hCG, Beta Chain, Quant, S: 89 m[IU]/mL — ABNORMAL HIGH (ref <5)

## 2021-06-28 LAB — LIPASE, BLOOD: Lipase: 33 U/L (ref 11–51)

## 2021-06-28 MED ORDER — ONDANSETRON 4 MG PO TBDP
4.0000 mg | ORAL_TABLET | Freq: Once | ORAL | Status: AC
  Administered 2021-06-28: 4 mg via ORAL
  Filled 2021-06-28: qty 1

## 2021-06-28 MED ORDER — ONDANSETRON 4 MG PO TBDP: 4.0000 mg | ORAL_TABLET | Freq: Three times a day (TID) | ORAL | 0 refills | Status: DC | PRN

## 2021-06-28 NOTE — ED Provider Notes (Signed)
[facility] Provider Note    Event Date/Time   First MD Initiated Contact with Patient 06/28/21 [phone]     (approximate)   History   Nausea   HPI  Katherine Downs is a 26 y.o. female who presents with complaints of nausea.  Patient reports this is been ongoing for about 3 weeks, she notes it seemed to start soon after starting metformin for a new diagnosis of diabetes.  She takes metformin 500 mg twice daily.  She also reports she took a pregnancy test yesterday and was positive.  She denies abdominal pain to me.  No vaginal bleeding     Physical Exam   Triage Vital Signs: ED Triage Vitals [06/28/21 0819]  Enc Vitals Group     BP (!) 143/98     Pulse Rate 92     Resp 16     Temp 97.9 F (36.6 C)     Temp Source Oral     SpO2 100 %     Weight 116.1 kg (256 lb)     Height 1.626 m (5\' 4" )     Head Circumference      Peak Flow      Pain Score 6     Pain Loc      Pain Edu?      Excl. in GC?     Most recent vital signs: Vitals:   06/28/21 0819 06/28/21 0950  BP: (!) 143/98 (!) 136/91  Pulse: 92 80  Resp: 16 16  Temp: 97.9 F (36.6 C)   SpO2: 100% 98%     General: Awake, no distress.  CV:  Good peripheral perfusion.  Resp:  Normal effort.  Abd:  No distention.  Soft, nontender, no CVA tenderness Other:     ED Results / Procedures / Treatments   Labs (all labs ordered are listed, but only abnormal results are displayed) Labs Reviewed  COMPREHENSIVE METABOLIC PANEL - Abnormal; Notable for the following components:      Result Value   Sodium 132 (*)    Glucose, Bld 228 (*)    Total Protein 8.3 (*)    All other components within normal limits  CBC - Abnormal; Notable for the following components:   Platelets 421 (*)    All other components within normal limits  URINALYSIS, ROUTINE W REFLEX MICROSCOPIC - Abnormal; Notable for the following components:   Protein, ur 30 (*)    Leukocytes,Ua TRACE (*)    Bacteria, UA FEW (*)    All  other components within normal limits  HCG, QUANTITATIVE, PREGNANCY - Abnormal; Notable for the following components:   hCG, Beta Chain, Quant, S 89 (*)    All other components within normal limits  LIPASE, BLOOD  POC URINE PREG, ED     EKG     RADIOLOGY     PROCEDURES:  Critical Care performed:   Procedures   MEDICATIONS ORDERED IN ED: Medications  ondansetron (ZOFRAN-ODT) disintegrating tablet 4 mg (4 mg Oral Given 06/28/21 0909)     IMPRESSION / MDM / ASSESSMENT AND PLAN / ED COURSE  I reviewed the triage vital signs and the nursing notes. Patient well-appearing and in no acute distress, her primary complaint is nausea.  Do suspect that metformin is causing her symptoms  Urine pregnancy test is negative here.  Pending hCG  Lab work overall reassuring, mild elevation of glucose, normal CBC, normal lipase  hCG has returned at 89, consistent with early pregnancy this  could certainly also be causing her nausea discussed this with her.  We will provide Zofran for as needed severe nausea relief, close follow-up with OB/GYN           FINAL CLINICAL IMPRESSION(S) / ED DIAGNOSES   Final diagnoses:  Nausea  Less than [redacted] weeks gestation of pregnancy     Rx / DC Orders   ED Discharge Orders          Ordered    ondansetron (ZOFRAN-ODT) 4 MG disintegrating tablet  Every 8 hours PRN        06/28/21 0944             Note:  This document was prepared using Dragon voice recognition software and may include unintentional dictation errors.   Jene Every, MD 06/28/21 [phone]

## 2021-06-28 NOTE — ED Triage Notes (Signed)
Patient c/o abdominal pain X3 weeks. C/o nausea. Reports missed period X7 days ago. Also started new diabetes medication around 3 weeks ago

## 2021-07-09 ENCOUNTER — Other Ambulatory Visit: Payer: Self-pay

## 2021-07-09 ENCOUNTER — Ambulatory Visit (LOCAL_COMMUNITY_HEALTH_CENTER): Payer: Medicaid Other

## 2021-07-09 VITALS — BP 148/86 | Ht 64.0 in | Wt 264.5 lb

## 2021-07-09 DIAGNOSIS — Z3201 Encounter for pregnancy test, result positive: Secondary | ICD-10-CM

## 2021-07-09 LAB — PREGNANCY, URINE: Preg Test, Ur: POSITIVE — AB

## 2021-07-09 MED ORDER — PRENATAL 27-0.8 MG PO TABS: 1.0000 | ORAL_TABLET | Freq: Every day | ORAL | 0 refills | Status: AC

## 2021-07-09 NOTE — Progress Notes (Addendum)
UPT positive. Plans prenatal care at [facility] and has appt 07/25/2021.  BP elevated today at 148/86. Denies hx of HBP. Takes metformin for diabetes as prescribed by Dr Letta Pate at [facility]. Pt reports that she contacted prescribing provider and was told metformin was safe during pregnancy.   Consult E Sciora, CNM who advises pt to seek prenatal care early as she would be considered high risk with diabetes and HBP.  Provider also agrees that metformin is safe in preg.   RN explained provider recommendations. Pt in agreement. Questions answered and reports understanding.  To DSS for medicaid/preg women. Jerel Shepherd, RN Consulted on the plan of care for this client.  I agree with the documented note and actions taken to provide care for this client.  Hazle Coca, CNM  Consulted on the plan of care for this client.  I agree with the documented note and actions taken to provide care for this client.  Hazle Coca, CNM

## 2021-07-24 ENCOUNTER — Encounter (HOSPITAL_COMMUNITY): Payer: Self-pay

## 2021-07-24 ENCOUNTER — Emergency Department (HOSPITAL_COMMUNITY)
Admission: EM | Admit: 2021-07-24 | Discharge: 2021-07-24 | Disposition: A | Discharge status: Home / Self Care | Payer: Medicaid Other | Attending: Emergency Medicine | Admitting: Emergency Medicine

## 2021-07-24 ENCOUNTER — Other Ambulatory Visit: Payer: Self-pay

## 2021-07-24 DIAGNOSIS — O26891 Other specified pregnancy related conditions, first trimester: Secondary | ICD-10-CM | POA: Insufficient documentation

## 2021-07-24 DIAGNOSIS — Z3A08 8 weeks gestation of pregnancy: Secondary | ICD-10-CM | POA: Diagnosis not present

## 2021-07-24 DIAGNOSIS — R1013 Epigastric pain: Secondary | ICD-10-CM | POA: Diagnosis not present

## 2021-07-24 MED ORDER — ALUM & MAG HYDROXIDE-SIMETH 200-200-20 MG/5ML PO SUSP
30.0000 mL | Freq: Once | ORAL | Status: AC
Start: 2021-07-24 — End: 2021-07-24
  Administered 2021-07-24: 30 mL via ORAL
  Filled 2021-07-24: qty 30

## 2021-07-24 MED ORDER — FAMOTIDINE 20 MG PO TABS: 20.0000 mg | ORAL_TABLET | Freq: Two times a day (BID) | ORAL | 0 refills | Status: DC

## 2021-07-24 MED ORDER — ACETAMINOPHEN 500 MG PO TABS
1000.0000 mg | ORAL_TABLET | Freq: Once | ORAL | Status: AC
  Administered 2021-07-24: 1000 mg via ORAL
  Filled 2021-07-24: qty 2

## 2021-07-24 NOTE — ED Provider Notes (Signed)
?[facility]PART[facility] ?Provider Note ? ? ?CSN: [id number] ?Arrival date & time: 07/24/21  0133 ? ?  ? ?History ? ?Chief Complaint  ?Patient presents with  ? Abdominal Pain  ? ? ?Katherine Downs is a 26 y.o. female. ? ?Patient presents to the emergency department for evaluation of epigastric pain.  Patient reports that symptoms have been ongoing for 2 days.  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  Pain is persistent in the center of her upper abdomen.  No discomfort below the umbilicus.  She has had previous cholecystectomy.  No vomiting, constipation, diarrhea.  Patient reports that she is [redacted] weeks pregnant.  No vaginal bleeding, discharge. ? ? ?  ? ?Home Medications ?Prior to Admission medications   ?Medication Sig Start Date End Date Taking? Authorizing Provider  ?famotidine (PEPCID) 20 MG tablet Take 1 tablet (20 mg total) by mouth 2 (two) times daily. 07/24/21  Yes Laney Bagshaw, Canary Brim, MD  ?albuterol (VENTOLIN HFA) 108 (90 Base) MCG/ACT inhaler Inhale 2 puffs into the lungs every 4 (four) hours as needed for wheezing or shortness of breath. 06/05/20   Triplett, Rulon Eisenmenger B, FNP  ?albuterol (VENTOLIN HFA) 108 (90 Base) MCG/ACT inhaler Inhale 2 puffs into the lungs every 6 (six) hours as needed for wheezing or shortness of breath. 02/18/21   Jene Every, MD  ?Prenatal Vit-Fe Fumarate-FA (MULTIVITAMIN-PRENATAL) 27-0.8 MG TABS tablet Take 1 tablet by mouth daily at 12 noon. 07/09/21 10/17/21  Federico Flake, MD  ?   ? ?Allergies    ?Ibuprofen   ? ?Review of Systems   ?Review of Systems  ?Gastrointestinal:  Positive for abdominal pain.  ? ?Physical Exam ?Updated Vital Signs ?BP 117/86   Pulse (!) 101   Temp 98.1 ?F (36.7 ?C) (Oral)   Resp 20   Ht 5\' 4"  (1.626 m)   Wt 120 kg   LMP 05/24/2021 (Exact Date) Comment: LMP 05/24/21 was heavier than normal, menses 04/10/21 was normal  SpO2 100%   BMI 45.41 kg/m?  ?Physical Exam ?Vitals and nursing note reviewed.   ?Constitutional:   ?   General: She is not in acute distress. ?   Appearance: She is well-developed.  ?HENT:  ?   Head: Normocephalic and atraumatic.  ?   Mouth/Throat:  ?   Mouth: Mucous membranes are moist.  ?Eyes:  ?   General: Vision grossly intact. Gaze aligned appropriately.  ?   Extraocular Movements: Extraocular movements intact.  ?   Conjunctiva/sclera: Conjunctivae normal.  ?Cardiovascular:  ?   Rate and Rhythm: Normal rate and regular rhythm.  ?   Pulses: Normal pulses.  ?   Heart sounds: Normal heart sounds, S1 normal and S2 normal. No murmur heard. ?  No friction rub. No gallop.  ?Pulmonary:  ?   Effort: Pulmonary effort is normal. No respiratory distress.  ?   Breath sounds: Normal breath sounds.  ?Abdominal:  ?   General: Bowel sounds are normal.  ?   Palpations: Abdomen is soft.  ?   Tenderness: There is no abdominal tenderness. There is no guarding or rebound.  ?   Hernia: No hernia is present.  ?Musculoskeletal:     ?   General: No swelling.  ?   Cervical back: Full passive range of motion without pain, normal range of motion and neck supple. No spinous process tenderness or muscular tenderness. Normal range of motion.  ?   Right lower leg: No  edema.  ?   Left lower leg: No edema.  ?Skin: ?   General: Skin is warm and dry.  ?   Capillary Refill: Capillary refill takes less than 2 seconds.  ?   Findings: No ecchymosis, erythema, rash or wound.  ?Neurological:  ?   General: No focal deficit present.  ?   Mental Status: She is alert and oriented to person, place, and time.  ?   GCS: GCS eye subscore is 4. GCS verbal subscore is 5. GCS motor subscore is 6.  ?   Cranial Nerves: Cranial nerves 2-12 are intact.  ?   Sensory: Sensation is intact.  ?   Motor: Motor function is intact.  ?   Coordination: Coordination is intact.  ?Psychiatric:     ?   Attention and Perception: Attention normal.     ?   Mood and Affect: Mood normal.     ?   Speech: Speech normal.     ?   Behavior: Behavior normal.  ? ? ?ED  Results / Procedures / Treatments   ?Labs ?(all labs ordered are listed, but only abnormal results are displayed) ?Labs Reviewed - No data to display ? ?EKG ?None ? ?Radiology ?No results found. ? ?Procedures ?Procedures  ? ? ?Medications Ordered in ED ?Medications  ?acetaminophen (TYLENOL) tablet 1,000 mg (1,000 mg Oral Given 07/24/21 0210)  ?alum & mag hydroxide-simeth (MAALOX/MYLANTA) 200-200-20 MG/5ML suspension 30 mL (30 mLs Oral Given 07/24/21 0210)  ? ? ?ED Course/ Medical Decision Making/ A&P ?  ?                        ?Medical Decision Making ?Risk ?OTC drugs. ? ? ?Patient is [redacted] weeks pregnant.  She presents with abdominal pain.  Pain is entirely in the epigastrium.  She does not have any lower abdominal or pelvic symptoms to suggest obstetric complication.  No concern for ectopic pregnancy or miscarriage based on her current symptoms and exam. ? ?Patient has had prior cholecystectomy.  Therefore gallbladder disease is not considered.  Patient's abdominal exam is benign.  She does not have any significant tenderness, no guarding, no rebound.  No concern for acute surgical process based on exam. ? ?Patient administered Tylenol and GI cocktail, no lidocaine.  She feels improved.  Does not require further work-up.  She has follow-up with OB/GYN. ? ? ? ? ? ? ? ?Final Clinical Impression(s) / ED Diagnoses ?Final diagnoses:  ?Epigastric pain  ? ? ?Rx / DC Orders ?ED Discharge Orders   ? ?      Ordered  ?  famotidine (PEPCID) 20 MG tablet  2 times daily       ? 07/24/21 0253  ? ?  ?  ? ?  ? ? ?  ?Gilda Crease, MD ?07/24/21 [phone] ? ?

## 2021-07-24 NOTE — ED Triage Notes (Signed)
Pt states she has had epigastric pain x2 days ?

## 2021-08-29 ENCOUNTER — Emergency Department (HOSPITAL_COMMUNITY)
Admission: EM | Admit: 2021-08-29 | Discharge: 2021-08-29 | Disposition: A | Discharge status: Home / Self Care | Payer: Medicaid Other | Attending: Emergency Medicine | Admitting: Emergency Medicine

## 2021-08-29 ENCOUNTER — Other Ambulatory Visit: Payer: Self-pay

## 2021-08-29 ENCOUNTER — Encounter (HOSPITAL_COMMUNITY): Payer: Self-pay

## 2021-08-29 DIAGNOSIS — O26891 Other specified pregnancy related conditions, first trimester: Secondary | ICD-10-CM | POA: Diagnosis present

## 2021-08-29 DIAGNOSIS — R1013 Epigastric pain: Secondary | ICD-10-CM | POA: Diagnosis not present

## 2021-08-29 DIAGNOSIS — Z3A12 12 weeks gestation of pregnancy: Secondary | ICD-10-CM | POA: Diagnosis not present

## 2021-08-29 DIAGNOSIS — R000 Tachycardia, unspecified: Secondary | ICD-10-CM | POA: Diagnosis not present

## 2021-08-29 DIAGNOSIS — Z20822 Contact with and (suspected) exposure to covid-19: Secondary | ICD-10-CM | POA: Insufficient documentation

## 2021-08-29 DIAGNOSIS — O99281 Endocrine, nutritional and metabolic diseases complicating pregnancy, first trimester: Secondary | ICD-10-CM | POA: Insufficient documentation

## 2021-08-29 DIAGNOSIS — E876 Hypokalemia: Secondary | ICD-10-CM | POA: Diagnosis not present

## 2021-08-29 LAB — URINALYSIS, ROUTINE W REFLEX MICROSCOPIC
Bilirubin Urine: NEGATIVE
Glucose, UA: NEGATIVE mg/dL
Hgb urine dipstick: NEGATIVE
Ketones, ur: 5 mg/dL — AB
Leukocytes,Ua: NEGATIVE
Nitrite: NEGATIVE
Protein, ur: 30 mg/dL — AB
Specific Gravity, Urine: 1.023 (ref 1.005–1.030)
pH: 6 (ref 5.0–8.0)

## 2021-08-29 LAB — CBC WITH DIFFERENTIAL/PLATELET
Abs Immature Granulocytes: 0.03 10*3/uL (ref 0.00–0.07)
Basophils Absolute: 0 10*3/uL (ref 0.0–0.1)
Basophils Relative: 0 %
Eosinophils Absolute: 0 10*3/uL (ref 0.0–0.5)
Eosinophils Relative: 0 %
HCT: 37.3 % (ref 36.0–46.0)
Hemoglobin: 12.6 g/dL (ref 12.0–15.0)
Immature Granulocytes: 1 %
Lymphocytes Relative: 21 %
Lymphs Abs: 1.3 10*3/uL (ref 0.7–4.0)
MCH: 29.1 pg (ref 26.0–34.0)
MCHC: 33.8 g/dL (ref 30.0–36.0)
MCV: 86.1 fL (ref 80.0–100.0)
Monocytes Absolute: 0.3 10*3/uL (ref 0.1–1.0)
Monocytes Relative: 5 %
Neutro Abs: 4.3 10*3/uL (ref 1.7–7.7)
Neutrophils Relative %: 73 %
Platelets: 339 10*3/uL (ref 150–400)
RBC: 4.33 MIL/uL (ref 3.87–5.11)
RDW: 13.4 % (ref 11.5–15.5)
WBC: 5.9 10*3/uL (ref 4.0–10.5)
nRBC: 0 % (ref 0.0–0.2)

## 2021-08-29 LAB — COMPREHENSIVE METABOLIC PANEL
ALT: 22 U/L (ref 0–44)
AST: 34 U/L (ref 15–41)
Albumin: 3.5 g/dL (ref 3.5–5.0)
Alkaline Phosphatase: 42 U/L (ref 38–126)
Anion gap: 9 (ref 5–15)
BUN: 6 mg/dL (ref 6–20)
CO2: 21 mmol/L — ABNORMAL LOW (ref 22–32)
Calcium: 8.7 mg/dL — ABNORMAL LOW (ref 8.9–10.3)
Chloride: 102 mmol/L (ref 98–111)
Creatinine, Ser: 0.42 mg/dL — ABNORMAL LOW (ref 0.44–1.00)
GFR, Estimated: >60 mL/min (ref >60)
Glucose, Bld: 147 mg/dL — ABNORMAL HIGH (ref 70–99)
Potassium: 3.1 mmol/L — ABNORMAL LOW (ref 3.5–5.1)
Sodium: 132 mmol/L — ABNORMAL LOW (ref 135–145)
Total Bilirubin: 0.5 mg/dL (ref 0.3–1.2)
Total Protein: 7.4 g/dL (ref 6.5–8.1)

## 2021-08-29 LAB — RESP PANEL BY RT-PCR (FLU A&B, COVID) ARPGX2
Influenza A by PCR: NEGATIVE
Influenza B by PCR: NEGATIVE
SARS Coronavirus 2 by RT PCR: NEGATIVE

## 2021-08-29 LAB — LIPASE, BLOOD: Lipase: 29 U/L (ref 11–51)

## 2021-08-29 MED ORDER — ACETAMINOPHEN 325 MG PO TABS
650.0000 mg | ORAL_TABLET | Freq: Once | ORAL | Status: AC
  Administered 2021-08-29: 650 mg via ORAL
  Filled 2021-08-29: qty 2

## 2021-08-29 MED ORDER — POTASSIUM CHLORIDE CRYS ER 20 MEQ PO TBCR
40.0000 meq | EXTENDED_RELEASE_TABLET | Freq: Once | ORAL | Status: AC
  Administered 2021-08-29: 40 meq via ORAL
  Filled 2021-08-29: qty 2

## 2021-08-29 MED ORDER — FAMOTIDINE 20 MG PO TABS: 20.0000 mg | ORAL_TABLET | Freq: Every day | ORAL | 0 refills | Status: DC

## 2021-08-29 NOTE — Discharge Instructions (Signed)
It was a pleasure taking care of you today! ? ?Your labs and urine were unremarkable today.  It is important that you call your OB/GYN on 09/01/2021 to set up a follow-up appointment earlier than your scheduled/12/23.  You will be sent a prescription for Pepcid, take as prescribed.  You may follow-up with your primary care provider as needed.  Return to the emergency department if you are experiencing increasing/worsening abdominal pain, vomiting, fever, worsening symptoms. ?

## 2021-08-29 NOTE — ED Triage Notes (Signed)
Pt arrievd via POV from home c/o abdominal pain, headaches and fever of 104 F at home. Pt thought it was food poisoning, but headache has been persistent X 3 days and reports new onset of fever that began today. Pt is 12-[redacted] wks pregnant. Pt has not taken anything for pain or fever at home PTA. Pts temp 101.1 F Oral in Triage.  ?

## 2021-08-29 NOTE — ED Provider Notes (Signed)
?[facility] EM[facility] DEPARTMENT ?Provider Note ? ? ?CSN: [id number] ?Arrival date & time: 08/29/21  1934 ? ?  ? ?History ? ?Chief Complaint  ?Patient presents with  ? Abdominal Pain  ? ? ?Katherine Downs is a 193P1011 26 y.o. female who is currently [redacted] weeks gestation, and presents to the emergency department complaining of intermittent epigastric abdominal pain onset 3 days.  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  She is unsure of new food intake.  She [facility].  Patient tried Tylenol at home with her last dose being at 6 PM yesterday for her symptoms.  Denies loss of fluids, vaginal bleeding, vaginal discharge, abdominal cramping, need to push.  ? ? ?Per patient chart review: Patient was evaluated by [facility] in [place] on 07/25/2021 for her initial OB appointment.  [facility] on 08/26/2021 due to pelvic pain and fluid leaking.  Ultrasound at that time showed a single living IUP with estimated gestational age of [redacted] weeks 0 days.  Heart rate was 129 bpm.  She has an appointment with a new OB on 09/03/2021. ? ? ? ?The history is provided by the patient. No language interpreter was used.  ? ?  ? ?Home Medications ?Prior to Admission medications   ?Medication Sig Start Date End Date Taking? Authorizing Provider  ?albuterol (VENTOLIN HFA) 108 (90 Base) MCG/ACT inhaler Inhale 2 puffs into the lungs every 4 (four) hours as needed for wheezing or shortness of breath. 06/05/20   Triplett, Rulon Eisenmengerari B, FNP  ?albuterol (VENTOLIN HFA) 108 (90 Base) MCG/ACT inhaler Inhale 2 puffs into the lungs every 6 (six) hours as needed for wheezing or shortness of breath. 02/18/21   Jene EveryKinner, Robert, MD  ?famotidine (PEPCID) 20 MG tablet Take 1 tablet (20 mg total) by mouth daily for 15 days. 08/29/21 09/13/21  Jaysa Kise A, PA-C  ?Prenatal Vit-Fe Fumarate-FA (MULTIVITAMIN-PRENATAL) 27-0.8 MG TABS tablet Take 1 tablet by mouth daily at 12 noon.  07/09/21 10/17/21  Federico FlakeNewton, Kimberly Niles, MD  ?   ? ?Allergies    ?Ibuprofen   ? ?Review of Systems   ?Review of Systems  ?Constitutional:  Positive for fever. Negative for chills.  ?Gastrointestinal:  Positive for abdominal pain. Negative for nausea and vomiting.  ?Genitourinary:  Negative for pelvic pain, vaginal bleeding and vaginal discharge.  ?Neurological:  Positive for headaches.  ?All other systems reviewed and are negative. ? ?Physical Exam ?Updated Vital Signs ?BP (!) 149/80   Pulse (!) 109   Temp (!) 101.1 ?F (38.4 ?C) (Oral)   Resp 18   Ht 5\' 4"  (1.626 m)   Wt 118.8 kg   SpO2 97%   BMI 44.97 kg/m?  ?Physical Exam ?Vitals and nursing note reviewed.  ?Constitutional:   ?   General: She is not in acute distress. ?   Appearance: She is not diaphoretic.  ?HENT:  ?   Head: Normocephalic and atraumatic.  ?   Mouth/Throat:  ?   Pharynx: No oropharyngeal exudate.  ?Eyes:  ?   General: No scleral icterus. ?   Conjunctiva/sclera: Conjunctivae normal.  ?Cardiovascular:  ?   Rate and Rhythm: Normal rate and regular rhythm.  ?   Pulses: Normal pulses.  ?   Heart sounds: Normal heart sounds.  ?Pulmonary:  ?   Effort: Pulmonary effort is normal. No respiratory distress.  ?   Breath sounds: Normal breath sounds. No  wheezing.  ?Abdominal:  ?   General: Bowel sounds are normal.  ?   Palpations: Abdomen is soft. There is no mass.  ?   Tenderness: There is no abdominal tenderness. There is no guarding or rebound.  ?Musculoskeletal:     ?   General: Normal range of motion.  ?   Cervical back: Normal range of motion and neck supple.  ?Skin: ?   General: Skin is warm and dry.  ?Neurological:  ?   Mental Status: She is alert.  ?Psychiatric:     ?   Behavior: Behavior normal.  ? ? ?ED Results / Procedures / Treatments   ?Labs ?(all labs ordered are listed, but only abnormal results are displayed) ?Labs Reviewed  ?COMPREHENSIVE METABOLIC PANEL - Abnormal; Notable for the following components:  ?    Result Value  ? Sodium 132  (*)   ? Potassium 3.1 (*)   ? CO2 21 (*)   ? Glucose, Bld 147 (*)   ? Creatinine, Ser 0.42 (*)   ? Calcium 8.7 (*)   ? All other components within normal limits  ?URINALYSIS, ROUTINE W REFLEX MICROSCOPIC - Abnormal; Notable for the following components:  ? Ketones, ur 5 (*)   ? Protein, ur 30 (*)   ? Bacteria, UA RARE (*)   ? All other components within normal limits  ?RESP PANEL BY RT-PCR (FLU A&B, COVID) ARPGX2  ?CBC WITH DIFFERENTIAL/PLATELET  ?LIPASE, BLOOD  ? ? ?EKG ?None ? ?Radiology ?No results found. ? ?Procedures ?Procedures  ? ? ?Medications Ordered in ED ?Medications  ?potassium chloride SA (KLOR-CON M) CR tablet 40 mEq (has no administration in time range)  ?acetaminophen (TYLENOL) tablet 650 mg (650 mg Oral Given 08/29/21 2144)  ? ? ?ED Course/ Medical Decision Making/ A&P ?Clinical Course as of 08/29/21 2325  ?Fri Aug 29, 2021  ?2142 Fetal heart rate obtained by myself via Doppler and notable for heart rate in the 120s. [SB]  ?2303 Discussed imaging findings with patient at bedside.  Discussed with patient we will give her a dose of potassium here in the ED.  Also we will send her the prescription for Pepcid.  Discussed with patient to call her OB/GYN office on 09/01/2021 to see if she can set up a follow-up appointment closer than 09/03/2021.  Answered all available questions.  Patient appears safe for discharge. [SB]  ?  ?Clinical Course User Index ?[SB] Alisen Marsiglia A, PA-C  ? ?                        ?Medical Decision Making ?Amount and/or Complexity of Data Reviewed ?Labs: ordered. ? ?Risk ?OTC drugs. ?Prescription drug management. ? ? ?Pt presents with intermittent epigastric abdominal pain onset 3 days.  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  Unsure of new food intake.  Patient is currently G3P1011 and currently [redacted] weeks gestation.  She has adequate OB care.  Vital signs, patient febrile at 101.1, slightly tachycardic at 109.  On exam patient without tenderness to  palpation noted to abdomen.  No acute cardiovascular, respiratory exam findings.  Differential diagnosis includes acute cystitis, COVID, flu, GERD. ? ? ?Additional history obtained:  ?External records from outside source obtained and reviewed including: Patient was evaluated by [facility] in [place] on 07/25/2021 for her initial OB appointment.  Patient was seen at [facility] emergency department on 08/26/2021 due to pelvic pain and fluid leaking.  Ultrasound at  that time showed a single living IUP with estimated gestational age of [redacted] weeks 0 days.  Heart rate was 129 bpm.  She has an appointment with a new OB on 09/03/2021. ? ?Labs:  ?I ordered, and personally interpreted labs.  The pertinent results include:   ?COVID flu swab negative ?Urinalysis unremarkable. ?Lipase at 29 and unremarkable. ?CBC unremarkable. ?CMP with potassium at 3.1, otherwise unremarkable. ? ?Medications:  ?I ordered medication including tylenol for fever management.  Patient also given 40 mEq of potassium in the ED. ?Reevaluation of the patient after these medicines and interventions, I reevaluated the patient and found that they have improved ?I have reviewed the patients home medicines and have made adjustments as needed ? ? ?Disposition: ?Patient presentation suspicious for epigastric abdominal pain in the setting of GERD.  Doubt acute cystitis, COVID, flu. After consideration of the diagnostic results and the patients response to treatment, I feel that the patient would benefit from Discharge home.  Detailed discussion with patient regarding need to maintain her scheduled follow-up appointment on 09/03/2021 with OB/GYN.  Supportive care measures and strict return precautions discussed with patient at bedside. Pt acknowledges and verbalizes understanding. Pt appears safe for discharge. Follow up as indicated in discharge paperwork.  ? ? ?This chart was dictated using voice recognition software, Dragon. Despite the best efforts of this provider  to proofread and correct errors, errors may still occur which can change documentation meaning. ? ?Final Clinical Impression(s) / ED Diagnoses ?Final diagnoses:  ?Epigastric pain  ? ? ?Rx / DC Orders ?ED Discharge Orde

## 2021-09-08 DIAGNOSIS — O99212 Obesity complicating pregnancy, second trimester: Secondary | ICD-10-CM | POA: Insufficient documentation

## 2021-09-08 DIAGNOSIS — J45909 Unspecified asthma, uncomplicated: Secondary | ICD-10-CM | POA: Insufficient documentation

## 2021-09-08 DIAGNOSIS — O34219 Maternal care for unspecified type scar from previous cesarean delivery: Secondary | ICD-10-CM | POA: Insufficient documentation

## 2021-09-08 DIAGNOSIS — O24112 Pre-existing diabetes mellitus, type 2, in pregnancy, second trimester: Secondary | ICD-10-CM | POA: Insufficient documentation

## 2021-09-09 ENCOUNTER — Telehealth: Payer: Medicaid Other

## 2021-09-09 ENCOUNTER — Telehealth: Payer: Self-pay

## 2021-09-09 NOTE — Telephone Encounter (Signed)
Called Pt twice to start her New OB Intake , no answer, left VM for call back or to re-schedule appt. Pts New OB appt is 09/24/21 @ 1:55p with Milas Hock, MD. ?

## 2021-09-15 DIAGNOSIS — F32A Depression, unspecified: Secondary | ICD-10-CM | POA: Insufficient documentation

## 2021-09-15 DIAGNOSIS — Z8759 Personal history of other complications of pregnancy, childbirth and the puerperium: Secondary | ICD-10-CM | POA: Insufficient documentation

## 2021-09-15 DIAGNOSIS — O99342 Other mental disorders complicating pregnancy, second trimester: Secondary | ICD-10-CM | POA: Insufficient documentation

## 2021-09-24 ENCOUNTER — Encounter: Payer: Medicaid Other | Admitting: Obstetrics and Gynecology

## 2021-12-21 ENCOUNTER — Other Ambulatory Visit: Payer: Self-pay

## 2021-12-21 ENCOUNTER — Emergency Department (HOSPITAL_COMMUNITY)
Admission: EM | Admit: 2021-12-21 | Discharge: 2021-12-21 | Disposition: A | Discharge status: Home / Self Care | Payer: Medicaid Other | Attending: Emergency Medicine | Admitting: Emergency Medicine

## 2021-12-21 ENCOUNTER — Encounter (HOSPITAL_COMMUNITY): Payer: Self-pay | Admitting: Emergency Medicine

## 2021-12-21 DIAGNOSIS — Z7982 Long term (current) use of aspirin: Secondary | ICD-10-CM | POA: Diagnosis not present

## 2021-12-21 DIAGNOSIS — O26892 Other specified pregnancy related conditions, second trimester: Secondary | ICD-10-CM | POA: Diagnosis present

## 2021-12-21 DIAGNOSIS — N898 Other specified noninflammatory disorders of vagina: Secondary | ICD-10-CM | POA: Diagnosis not present

## 2021-12-21 DIAGNOSIS — L292 Pruritus vulvae: Secondary | ICD-10-CM | POA: Insufficient documentation

## 2021-12-21 DIAGNOSIS — E119 Type 2 diabetes mellitus without complications: Secondary | ICD-10-CM | POA: Insufficient documentation

## 2021-12-21 DIAGNOSIS — Z3A28 28 weeks gestation of pregnancy: Secondary | ICD-10-CM | POA: Diagnosis not present

## 2021-12-21 DIAGNOSIS — R82998 Other abnormal findings in urine: Secondary | ICD-10-CM | POA: Insufficient documentation

## 2021-12-21 DIAGNOSIS — O24112 Pre-existing diabetes mellitus, type 2, in pregnancy, second trimester: Secondary | ICD-10-CM

## 2021-12-21 DIAGNOSIS — Z8619 Personal history of other infectious and parasitic diseases: Secondary | ICD-10-CM

## 2021-12-21 LAB — URINALYSIS, ROUTINE W REFLEX MICROSCOPIC
Bilirubin Urine: NEGATIVE
Glucose, UA: >=500 mg/dL — AB
Hgb urine dipstick: NEGATIVE
Ketones, ur: 5 mg/dL — AB
Leukocytes,Ua: NEGATIVE
Nitrite: NEGATIVE
Protein, ur: NEGATIVE mg/dL
Specific Gravity, Urine: 1.033 — ABNORMAL HIGH (ref 1.005–1.030)
pH: 6 (ref 5.0–8.0)

## 2021-12-21 LAB — WET PREP, GENITAL
Clue Cells Wet Prep HPF POC: NONE SEEN
Sperm: NONE SEEN
Trich, Wet Prep: NONE SEEN
WBC, Wet Prep HPF POC: >=10 — AB (ref <10)
Yeast Wet Prep HPF POC: NONE SEEN

## 2021-12-21 LAB — CBG MONITORING, ED: Glucose-Capillary: 167 mg/dL — ABNORMAL HIGH (ref 70–99)

## 2021-12-21 MED ORDER — FLUCONAZOLE 150 MG PO TABS
150.0000 mg | ORAL_TABLET | Freq: Once | ORAL | Status: AC
  Administered 2021-12-21: 150 mg via ORAL
  Filled 2021-12-21: qty 1

## 2021-12-21 NOTE — Progress Notes (Signed)
Spoke with [facility] RN. Pt's glucose in her urine is greater than 500mg . Says she will get a CBG and let know the results.

## 2021-12-21 NOTE — Progress Notes (Signed)
Spoke with Dr. Shawnie Pons. [facility] can treat the pt's yeast infection and she can be discharged home as long as the fetal heart rate is reactive.

## 2021-12-21 NOTE — Progress Notes (Signed)
Spoke with Teacher, English as a foreign language. They can treat the pt's yeast infection and she can be d/c home as long as the fetal heart rate is reactive. FHR is not tracing and her monitor needs to be adjusted.

## 2021-12-21 NOTE — ED Provider Notes (Signed)
[facility] EMERGENCY DEPARTMENT Provider Note   CSN: [id number] Arrival date & time: 12/21/21  1654     History  Chief Complaint  Patient presents with   Vaginitis    Katherine Downs is a 26 y.o. female G3P1011 at [redacted] weeks gestation with chief complaint of vaginal discharge and pruritus over the last 3 weeks.  Denies complications with pregnancy so far.  Discharge described as whitish-yellow without odor.  Tried over-the-counter topical miconazole twice daily for 7 days, with some initial relief, and then worsening of symptoms.  Concerned of possible yeast infection.  Was informed this may occur due to her diabetes and recent initiation of diabetes medication (05/2021).  Denies hematuria, fevers, chills, severe abdominal pain, N/V/D, contractions, flank pain, or vaginal bleeding.  No other complaints at this time.  The history is provided by the patient and medical records.       Home Medications Prior to Admission medications   Medication Sig Start Date End Date Taking? Authorizing Provider  albuterol (VENTOLIN HFA) 108 (90 Base) MCG/ACT inhaler Inhale 2 puffs into the lungs every 4 (four) hours as needed for wheezing or shortness of breath. 06/05/20   Triplett, Johnette Abraham B, FNP  albuterol (VENTOLIN HFA) 108 (90 Base) MCG/ACT inhaler Inhale 2 puffs into the lungs every 6 (six) hours as needed for wheezing or shortness of breath. 02/18/21   Lavonia Drafts, MD  aspirin 81 MG EC tablet Take 1 tablet by mouth daily. 09/15/21   [provider]  Blood Glucose Monitoring Suppl (FIFTY50 GLUCOSE METER 2.0) w/Device KIT Check blood sugar fasting and 1 hour after the first bite of each meal 09/15/21   [provider]  Blood Pressure Monitoring (BLOOD PRESSURE KIT) KIT Use on upper arm to check BP regularly to monitor for pre-eclampsia,  Report BPs > 140/90. Use code Z34.90 09/15/21   [provider]  famotidine (PEPCID) 20 MG tablet Take 1 tablet (20 mg total) by mouth daily for  15 days. 08/29/21 09/13/21  Blue, Soijett A, PA-C  magnesium oxide (MAG-OX) 400 MG tablet Take by mouth. 09/15/21 09/15/22  [provider]  metoCLOPramide (REGLAN) 5 MG tablet Take by mouth. 09/15/21 09/15/22  [provider]  promethazine (PHENERGAN) 25 MG tablet Take by mouth. 09/15/21   [provider]  sertraline (ZOLOFT) 50 MG tablet Take by mouth. 09/15/21   [provider]      Allergies    Ibuprofen    Review of Systems   Review of Systems  Genitourinary:  Positive for vaginal discharge (and pruritis).    Physical Exam Updated Vital Signs BP (!) 143/80 (BP Location: Right Arm)   Pulse (!) 109   Temp 97.6 F (36.4 C) (Oral)   Resp 18   Ht _0  (1.626 m)   Wt 119.7 kg   LMP 05/24/2021 (Exact Date) Comment: LMP 05/24/21 was heavier than normal, menses 04/10/21 was normal  SpO2 99%   BMI 45.32 kg/m  Physical Exam Vitals and nursing note reviewed. Exam conducted with a chaperone present.  Constitutional:      General: She is not in acute distress.    Appearance: She is well-developed. She is not ill-appearing, toxic-appearing or diaphoretic.  HENT:     Head: Normocephalic and atraumatic.  Eyes:     Conjunctiva/sclera: Conjunctivae normal.  Cardiovascular:     Rate and Rhythm: Normal rate and regular rhythm.     Pulses: Normal pulses.     Heart sounds: Normal heart sounds.  No murmur heard. Pulmonary:     Effort: Pulmonary effort is normal. No respiratory distress.     Breath sounds: Normal breath sounds.  Abdominal:     Palpations: Abdomen is soft. There is mass ([redacted] weeks gestation).     Tenderness: There is no abdominal tenderness.  Genitourinary:    Exam position: Lithotomy position.     Pubic Area: No rash.      Labia:        Right: No rash or tenderness.        Left: No rash or tenderness.      Vagina: Vaginal discharge present.     Comments: Mild white, thin, creamy discharge appreciated of the labia and vaginal entrance.  Area  nontender.  No blood appreciated. Musculoskeletal:        General: No swelling.     Cervical back: Neck supple.  Skin:    General: Skin is warm and dry.     Capillary Refill: Capillary refill takes less than 2 seconds.  Neurological:     Mental Status: She is alert and oriented to person, place, and time.  Psychiatric:        Mood and Affect: Mood normal.     ED Results / Procedures / Treatments   Labs (all labs ordered are listed, but only abnormal results are displayed) Labs Reviewed  WET PREP, GENITAL - Abnormal; Notable for the following components:      Result Value   WBC, Wet Prep HPF POC >=10 (*)    All other components within normal limits  URINALYSIS, ROUTINE W REFLEX MICROSCOPIC - Abnormal; Notable for the following components:   Specific Gravity, Urine 1.033 (*)    Glucose, UA >=500 (*)    Ketones, ur 5 (*)    Bacteria, UA RARE (*)    All other components within normal limits  CBG MONITORING, ED - Abnormal; Notable for the following components:   Glucose-Capillary 167 (*)    All other components within normal limits  URINE CULTURE  GC/CHLAMYDIA PROBE AMP ([place]) NOT AT [facility]    EKG None  Radiology No results found.  Procedures Procedures    Medications Ordered in ED Medications  fluconazole (DIFLUCAN) tablet 150 mg (150 mg Oral Given 12/21/21 2015)    ED Course/ Medical Decision Making/ A&P                           Medical Decision Making Amount and/or Complexity of Data Reviewed Labs: ordered.  Risk Prescription drug management.   26 y.o. female presents to the ED for concern of Vaginitis   This involves an extensive number of treatment options, and is a complaint that carries with it a high risk of complications and morbidity.    Past Medical History / Co-morbidities / Social History: Hx of DMT2, depression, asthma, obesity, GERD, IDA Social Determinants of Health include: None  Additional History:  Internal and external records  from outside source obtained and reviewed including OB notes, treat for UTI  Lab Tests: I ordered, and personally interpreted labs.  The pertinent results include:   CBG: 167 Wet Prep: WBC > 10, negative for BV, trichomonas, and yeast UA: Glucose > 500, rare bacteria  Imaging Studies: None  ED Course: Pt well-appearing on exam.  Nontoxic, non-septic appearing in NAD.  Sitting comfortably.  Chief complaint of vaginal discharge with some pruritus.  [redacted] weeks gestation, without any significant complications thus far.  Without  urinary symptoms, flank pain, abdominal pain, fevers.  Low initial suspicion for pyelonephritis or acute cystitis.  Patient afebrile.  Vitals stable.  UA not suggestive of UTI, though has elevated glucose.  CBG 167.  Wet prep negative for bacterial vaginosis, trichomonas, and yeast.  Has tried several courses of over-the-counter treatment for yeast infection, which she has had several times throughout the pregnancy.  Fetal heart tones observed, do not appear concerning.  OB/GYN following.  Discussed patient treatment with OB/GYN on-call.  Though swab test was negative today for vaginal yeast infection, recommended by OB/GYN on-call to proceed with treatment for vaginal yeast infection with the use of Diflucan.  This was repeated back and confirmed.  One-time dose provided in the ED.  Recommend close follow-up with OB/GYN as planned.  Gonorrhea and Chlamydia test pending for completeness.  Patient satisfied with today's encounter.  Patient in NAD and in good condition at time of discharge.  Disposition: After consideration the patient's encounter today, I do not feel today's workup suggests an emergent condition requiring admission or immediate intervention beyond what has been performed at this time.  Safe for discharge; instructed to return immediately for worsening symptoms, change in symptoms or any other concerns.  I have reviewed the patients home medicines and have made  adjustments as needed.  Discussed course of treatment with the patient, whom demonstrated understanding.  Patient in agreement and has no further questions.    I discussed this case with my attending physician Dr. Roderic Palau, who agreed with the proposed treatment course and cosigned this note including patient's presenting symptoms, physical exam, and planned diagnostics and interventions.  Attending physician stated agreement with plan or made changes to plan which were implemented.     This chart was dictated using voice recognition software.  Despite best efforts to proofread, errors can occur which can change the documentation meaning.         Final Clinical Impression(s) / ED Diagnoses Final diagnoses:  Vaginal pruritus  History of candidiasis of vagina  Type 2 diabetes mellitus complicating pregnancy, antepartum, second trimester  [redacted] weeks gestation of pregnancy    Rx / DC Orders ED Discharge Orders     None         Candace Cruise 67/34/19 1217    Milton Ferguson, MD 12/26/21 [phone]

## 2021-12-21 NOTE — ED Notes (Signed)
OB rapid response RN called to ask RN to obtain a CBG on pt as her UA showed greater than 500. Dr. Estell Harpin made aware. CBG-167. This RN called OB Rapid to give results.

## 2021-12-21 NOTE — ED Notes (Signed)
Patient transported to X-ray with her daughter

## 2021-12-21 NOTE — ED Notes (Signed)
OB Rapid response Nurse contacted for further support.

## 2021-12-21 NOTE — Progress Notes (Signed)
Report given to Beverly Hanks RN. 

## 2021-12-21 NOTE — Discharge Instructions (Addendum)
Your swabs today were negative for yeast infection or bacterial vaginosis.  Gonorrhea and Chlamydia cultures are still pending.  Your urine does show some elevated glucose, but is without evidence of UTI.  Discussed results with on-call OB/GYN.  Is recommended you continue with treatment for vaginal yeast infection even though negative on exam today.  A also close follow-up with OB/GYN within the next 2 to 3 days for reevaluation and continued medical management.  Return to the ED for new or worsening symptoms as discussed

## 2021-12-21 NOTE — Progress Notes (Signed)
Spoke with [name] RN due to no tracing on EFM, advised that patient off and in x-ray.

## 2021-12-21 NOTE — Progress Notes (Signed)
Received a call from [facility] RN. Pt is a G3P1 at 28 5/[redacted] weeks gestation presenting with c/o ? Yeast infection. Pt gets her care at [facility] and is high risk for elevated blood pressures, diabetes, and is a previous C/S.

## 2021-12-21 NOTE — ED Triage Notes (Signed)
Patient c/o possible yeast infection due to diabetic medication. Patient reports external vaginal bleeding from scratching but denies any external vaginal bleeding. Per patient white, yellow discharge without odor. Patient is [redacted] weeks pregnant with 2nd pregnancy. Denies any complications with this pregnancy or previous. Patient goes to [facility] metern[facility] fetal medicine for OB care.

## 2021-12-21 NOTE — Progress Notes (Signed)
Spoke with [name] RN and Bing Quarry PA regarding orders received from Dr Shawnie Pons.  Advised they will treat for yeast and discharge patient to self care.

## 2021-12-21 NOTE — Progress Notes (Signed)
Spoke with Dr Shawnie Pons regarding patient EFM tracing and lab results.  Received order for patient discharge and treatment for yeast due to recent antibiotic administration.

## 2021-12-23 LAB — URINE CULTURE

## 2021-12-24 LAB — GC/CHLAMYDIA PROBE AMP (~~LOC~~) NOT AT ARMC
Chlamydia: NEGATIVE
Comment: NEGATIVE
Comment: NORMAL
Neisseria Gonorrhea: NEGATIVE

## 2022-03-12 ENCOUNTER — Encounter (HOSPITAL_COMMUNITY): Payer: Self-pay | Admitting: *Deleted

## 2022-03-12 ENCOUNTER — Emergency Department (HOSPITAL_COMMUNITY): Payer: Medicaid Other

## 2022-03-12 ENCOUNTER — Other Ambulatory Visit: Payer: Self-pay

## 2022-03-12 ENCOUNTER — Emergency Department (HOSPITAL_COMMUNITY)
Admission: EM | Admit: 2022-03-12 | Discharge: 2022-03-12 | Disposition: A | Discharge status: Home / Self Care | Payer: Medicaid Other | Attending: Emergency Medicine | Admitting: Emergency Medicine

## 2022-03-12 DIAGNOSIS — Z20822 Contact with and (suspected) exposure to covid-19: Secondary | ICD-10-CM | POA: Insufficient documentation

## 2022-03-12 DIAGNOSIS — B9689 Other specified bacterial agents as the cause of diseases classified elsewhere: Secondary | ICD-10-CM | POA: Diagnosis not present

## 2022-03-12 DIAGNOSIS — R109 Unspecified abdominal pain: Secondary | ICD-10-CM | POA: Diagnosis not present

## 2022-03-12 DIAGNOSIS — R110 Nausea: Secondary | ICD-10-CM | POA: Diagnosis not present

## 2022-03-12 DIAGNOSIS — O864 Pyrexia of unknown origin following delivery: Secondary | ICD-10-CM | POA: Diagnosis present

## 2022-03-12 DIAGNOSIS — R509 Fever, unspecified: Secondary | ICD-10-CM

## 2022-03-12 LAB — URINALYSIS, ROUTINE W REFLEX MICROSCOPIC
Bilirubin Urine: NEGATIVE
Glucose, UA: NEGATIVE mg/dL
Ketones, ur: NEGATIVE mg/dL
Nitrite: NEGATIVE
Protein, ur: NEGATIVE mg/dL
Specific Gravity, Urine: 1.012 (ref 1.005–1.030)
pH: 7 (ref 5.0–8.0)

## 2022-03-12 LAB — COMPREHENSIVE METABOLIC PANEL
ALT: 18 U/L (ref 0–44)
AST: 16 U/L (ref 15–41)
Albumin: 3.6 g/dL (ref 3.5–5.0)
Alkaline Phosphatase: 65 U/L (ref 38–126)
Anion gap: 9 (ref 5–15)
BUN: <5 mg/dL — ABNORMAL LOW (ref 6–20)
CO2: 25 mmol/L (ref 22–32)
Calcium: 8.7 mg/dL — ABNORMAL LOW (ref 8.9–10.3)
Chloride: 104 mmol/L (ref 98–111)
Creatinine, Ser: 0.57 mg/dL (ref 0.44–1.00)
GFR, Estimated: >60 mL/min (ref >60)
Glucose, Bld: 119 mg/dL — ABNORMAL HIGH (ref 70–99)
Potassium: 3 mmol/L — ABNORMAL LOW (ref 3.5–5.1)
Sodium: 138 mmol/L (ref 135–145)
Total Bilirubin: 0.6 mg/dL (ref 0.3–1.2)
Total Protein: 7.3 g/dL (ref 6.5–8.1)

## 2022-03-12 LAB — CBC WITH DIFFERENTIAL/PLATELET
Abs Immature Granulocytes: 0.03 10*3/uL (ref 0.00–0.07)
Basophils Absolute: 0 10*3/uL (ref 0.0–0.1)
Basophils Relative: 0 %
Eosinophils Absolute: 0.1 10*3/uL (ref 0.0–0.5)
Eosinophils Relative: 1 %
HCT: 32.3 % — ABNORMAL LOW (ref 36.0–46.0)
Hemoglobin: 10.2 g/dL — ABNORMAL LOW (ref 12.0–15.0)
Immature Granulocytes: 0 %
Lymphocytes Relative: 17 %
Lymphs Abs: 2 10*3/uL (ref 0.7–4.0)
MCH: 25.6 pg — ABNORMAL LOW (ref 26.0–34.0)
MCHC: 31.6 g/dL (ref 30.0–36.0)
MCV: 81.2 fL (ref 80.0–100.0)
Monocytes Absolute: 0.5 10*3/uL (ref 0.1–1.0)
Monocytes Relative: 5 %
Neutro Abs: 8.9 10*3/uL — ABNORMAL HIGH (ref 1.7–7.7)
Neutrophils Relative %: 77 %
Platelets: 487 10*3/uL — ABNORMAL HIGH (ref 150–400)
RBC: 3.98 MIL/uL (ref 3.87–5.11)
RDW: 15.7 % — ABNORMAL HIGH (ref 11.5–15.5)
WBC: 11.5 10*3/uL — ABNORMAL HIGH (ref 4.0–10.5)
nRBC: 0 % (ref 0.0–0.2)

## 2022-03-12 LAB — PROTIME-INR
INR: 1.1 (ref 0.8–1.2)
Prothrombin Time: 14.2 seconds (ref 11.4–15.2)

## 2022-03-12 LAB — PREGNANCY, URINE: Preg Test, Ur: NEGATIVE

## 2022-03-12 LAB — MAGNESIUM: Magnesium: 1.7 mg/dL (ref 1.7–2.4)

## 2022-03-12 LAB — LACTIC ACID, PLASMA: Lactic Acid, Venous: 1.2 mmol/L (ref 0.5–1.9)

## 2022-03-12 LAB — RESP PANEL BY RT-PCR (FLU A&B, COVID) ARPGX2
Influenza A by PCR: NEGATIVE
Influenza B by PCR: NEGATIVE
SARS Coronavirus 2 by RT PCR: NEGATIVE

## 2022-03-12 LAB — APTT: aPTT: 34 seconds (ref 24–36)

## 2022-03-12 MED ORDER — IOHEXOL 300 MG/ML  SOLN
100.0000 mL | Freq: Once | INTRAMUSCULAR | Status: AC | PRN
  Administered 2022-03-12: 100 mL via INTRAVENOUS

## 2022-03-12 MED ORDER — VANCOMYCIN HCL IN DEXTROSE 1-5 GM/200ML-% IV SOLN
1000.0000 mg | Freq: Once | INTRAVENOUS | Status: DC
  Filled 2022-03-12: qty 200

## 2022-03-12 MED ORDER — LACTATED RINGERS IV BOLUS
2000.0000 mL | Freq: Once | INTRAVENOUS | Status: AC
  Administered 2022-03-12: 2000 mL via INTRAVENOUS

## 2022-03-12 MED ORDER — SODIUM CHLORIDE 0.9 % IV SOLN
2.0000 g | Freq: Once | INTRAVENOUS | Status: AC
  Administered 2022-03-12: 2 g via INTRAVENOUS
  Filled 2022-03-12: qty 12.5

## 2022-03-12 MED ORDER — AMOXICILLIN-POT CLAVULANATE 875-125 MG PO TABS: 1.0000 | ORAL_TABLET | Freq: Two times a day (BID) | ORAL | 0 refills | Status: DC

## 2022-03-12 MED ORDER — AMOXICILLIN-POT CLAVULANATE 875-125 MG PO TABS: 1.0000 | ORAL_TABLET | Freq: Two times a day (BID) | ORAL | 0 refills | Status: AC

## 2022-03-12 MED ORDER — ACETAMINOPHEN 500 MG PO TABS
1000.0000 mg | ORAL_TABLET | Freq: Once | ORAL | Status: AC
  Administered 2022-03-12: 1000 mg via ORAL
  Filled 2022-03-12: qty 2

## 2022-03-12 NOTE — ED Provider Notes (Signed)
[facility] EMERGENCY DEPARTMENT Provider Note   CSN: [id number] Arrival date & time: 03/12/22  1712     History Chief Complaint  Patient presents with   Fever    HPI Katherine Downs is a 26 y.o. female presenting for abdominal pain and fevers.  She is status post C-section approximately 3 weeks.  She has had pain at her surgical site, swelling, discharge from her surgical site, 1 area has been draining purulence.  She endorses fevers, nausea.  Denies vomiting.  Patient otherwise ambulatory.  Endorses minor cough without productive component.   Patient's recorded medical, surgical, social, medication list and allergies were reviewed in the Snapshot window as part of the initial history.   Review of Systems   Review of Systems  Constitutional:  Positive for fever. Negative for chills and unexpected weight change.  HENT:  Negative for ear pain and sore throat.   Eyes:  Negative for pain and visual disturbance.  Respiratory:  Negative for cough and shortness of breath.   Cardiovascular:  Negative for chest pain and palpitations.  Gastrointestinal:  Positive for abdominal pain. Negative for vomiting.  Genitourinary:  Negative for dysuria and hematuria.  Musculoskeletal:  Negative for arthralgias and back pain.  Skin:  Negative for color change and rash.  Neurological:  Negative for seizures and syncope.  All other systems reviewed and are negative.   Physical Exam Updated Vital Signs BP (!) 130/90   Pulse (!) 103   Temp (!) 100.6 F (38.1 C)   Resp (!) 27   Ht 5\' 4"  (2.831 m)   Wt 114.8 kg   LMP 05/24/2021 (Exact Date) Comment: LMP 05/24/21 was heavier than normal, menses 04/10/21 was normal  SpO2 99%   Breastfeeding Yes   BMI 43.43 kg/m  Physical Exam Vitals and nursing note reviewed.  Constitutional:      General: She is not in acute distress.    Appearance: She is well-developed.  HENT:     Head: Normocephalic and atraumatic.  Eyes:     Conjunctiva/sclera:  Conjunctivae normal.  Cardiovascular:     Rate and Rhythm: Normal rate and regular rhythm.     Heart sounds: No murmur heard. Pulmonary:     Effort: Pulmonary effort is normal. No respiratory distress.     Breath sounds: Normal breath sounds.  Abdominal:     Palpations: Abdomen is soft.     Tenderness: There is no abdominal tenderness.  Musculoskeletal:        General: No swelling, tenderness, deformity or signs of injury.     Cervical back: Neck supple.     Comments: Wound is mostly clean.  There are areas of erythema, purulence draining around one of the stitches that appears to have dehisced  Skin:    General: Skin is warm and dry.     Capillary Refill: Capillary refill takes less than 2 seconds.  Neurological:     Mental Status: She is alert.  Psychiatric:        Mood and Affect: Mood normal.      ED Course/ Medical Decision Making/ A&P    Procedures Procedures   Medications Ordered in ED Medications  acetaminophen (TYLENOL) tablet 1,000 mg (1,000 mg Oral Given 03/12/22 1939)  iohexol (OMNIPAQUE) 300 MG/ML solution 100 mL (100 mLs Intravenous Contrast Given 03/12/22 2132)  lactated ringers bolus 2,000 mL (2,000 mLs Intravenous New Bag/Given 03/12/22 2156)  ceFEPIme (MAXIPIME) 2 g in sodium chloride 0.9 % 100 mL IVPB (2 g Intravenous  New Bag/Given 03/12/22 2157)  Medical Decision Making:   Katherine Downs is a 26 y.o. female who presented to the ED today with abdominal pain, detailed above.     Complete initial physical exam performed, notably the patient  was tachycardic and febrile.  Tachycardia resolved with Tylenol and fever improved.  Patient meets sepsis criteria on arrival and treated as code sepsis bedded immediately, started on IV fluids and IV antibiotics.     Reviewed and confirmed nursing documentation for past medical history, family history, social history.    Initial Assessment:   With the patient's presentation of abdominal pain, most likely diagnosis is  endometritis. Other diagnoses were considered including (but not limited to) gastroenteritis, colitis, small bowel obstruction, appendicitis, cholecystitis, pancreatitis, nephrolithiasis, UTI, pyleonephritis, ruptured ectopic pregnancy, PID, ovarian torsion. These are considered less likely due to history of present illness and physical exam findings.   This is most consistent with an acute life/limb threatening illness complicated by underlying chronic conditions.   Initial Plan:  CBC/CMP to evaluate for underlying infectious/metabolic etiology for patient's abdominal pain  Lipase to evaluate for pancreatitis  EKG to evaluate for cardiac source of pain  CTAB/Pelvis with contrast to evaluate for structural/surgical etiology of patients' severe abdominal pain.  Urinalysis and repeat physical assessment to evaluate for UTI/Pyelonpehritis  Empiric management of symptoms with escalating pain control and antiemetics as needed.   Initial Study Results:   Laboratory  All laboratory results reviewed without evidence of clinically relevant pathology.     EKG EKG was reviewed independently. Rate, rhythm, axis, intervals all examined and without medically relevant abnormality. ST segments without concerns for elevations.    Radiology All images reviewed independently. Agree with radiology report at this time.   CT ABDOMEN PELVIS W CONTRAST  Result Date: 03/12/2022 CLINICAL DATA:  Right lower quadrant abdominal pain. Patient states 3 weeks ago she had a C-section. Two days ago she noticed drainage from the C-section incision site. EXAM: CT ABDOMEN AND PELVIS WITH CONTRAST TECHNIQUE: Multidetector CT imaging of the abdomen and pelvis was performed using the standard protocol following bolus administration of intravenous contrast. RADIATION DOSE REDUCTION: This exam was performed according to the departmental dose-optimization program which includes automated exposure control, adjustment of the mA and/or kV  according to patient size and/or use of iterative reconstruction technique. CONTRAST:  OMNIPAQUE IOHEXOL 300 MG/ML  SOLN COMPARISON:  None Available. FINDINGS: Lower chest: No acute abnormality. Hepatobiliary: No focal liver abnormality is seen. Status post cholecystectomy. No biliary dilatation. Pancreas: Unremarkable. No pancreatic ductal dilatation or surrounding inflammatory changes. Spleen: Normal in size without focal abnormality. Adrenals/Urinary Tract: Adrenal glands are unremarkable. Small area of low perfusion about the outer aspect of the right interpolar region with mild adjacent fat stranding. The right ureter is mildly dilated with mild enhancement without evidence of ureteral calculus. Bladder is unremarkable. Stomach/Bowel: Stomach is within normal limits. Appendix not definitely identified, however no inflammatory changes in the pericecal region. No evidence of bowel wall thickening, distention, or inflammatory changes. Vascular/Lymphatic: No significant vascular findings are present. No enlarged abdominal or pelvic lymph nodes. Reproductive: Post gravid enlarged uterus with heterogeneous material within the endometrial canal. Mild adjacent fat stranding, likely postsurgical. No fluid collection. Other: Postsurgical changes in the anterior abdominal wall for recent cesarean section. No fluid collection or abscess. Musculoskeletal: No acute or significant osseous findings. IMPRESSION: 1. Postsurgical changes in the anterior abdominal wall for recent cesarean section. No fluid collection or abscess. 2. Post gravid enlarged  uterus with heterogeneous material within the endometrial canal. No periuterine fluid collection or hematoma. 3. Small area of low perfusion about the outer aspect of the right interpolar region with mild adjacent fat stranding and mild dilatation of the right ureter without evidence of ureteral calculus, concerning for pyelonephritis. Differential also includes hydronephrosis  secondary to a recent gestation. Correlate with urinalysis. 4. No evidence of bowel obstruction or inflammatory changes in the bowel loops. Appendix not definitely identified, however no inflammatory changes in the pericecal region. Electronically Signed   By: Larose Hires D.O.   On: 03/12/2022 21:58   DG Chest 2 View  Result Date: 03/12/2022 CLINICAL DATA:  Cough and fever EXAM: CHEST - 2 VIEW COMPARISON:  Chest 02/18/2021 FINDINGS: Mild airspace disease left costophrenic angle, not present previously. Possible atelectasis or infiltrate. No effusion. Right lung clear. Negative for heart failure. IMPRESSION: Mild left lower lobe atelectasis/infiltrate Electronically Signed   By: Marlan Palau M.D.   On: 03/12/2022 18:43    Final Reassessment and Plan:   Patient's history of present illness and physical exam findings are most consistent with likely developing endometritis given her fever, abdominal pain, irritation around her surgical site.  CT scan without acute abscess collection.  Consulted gynecology for further recommendations given presentation of abdominal pain, vaginal discharge and abnormalities on the surgical site and they recommended treatment for endometritis with p.o. Augmentin, 48-hour follow-up in gynecology office.  Discussed this with the patient.  She would like to be discharged as she has had gross symptomatic improvement after antipyretics, IV fluids and her pain is well controlled at this time.  She stated that she would be able to pick up her antibiotic in the morning.  She was treated with cefepime alone at this time but would like to be discharged to go pump.  She will pick up Augmentin in the morning, complete treatment for endometritis and follow-up closely with gynecology in a.m.  Given overall symptomatic improvement, overall well appearance, this is reasonable.  Strict return precautions regarding redevelopment of symptoms, acute worsening, development of urinary symptoms or any  other focal pathology reinforced with the patient who expressed understanding.  Patient ambulatory tolerating p.o. intake in no acute distress at time of discharge.     Clinical Impression:  1. Fever, unspecified fever cause      Discharge   Final Clinical Impression(s) / ED Diagnoses Final diagnoses:  Fever, unspecified fever cause    Rx / DC Orders ED Discharge Orders          Ordered    amoxicillin-clavulanate (AUGMENTIN) 875-125 MG tablet  Every 12 hours        03/12/22 2220              Glyn Ade, MD 03/12/22 2235

## 2022-03-12 NOTE — ED Triage Notes (Addendum)
Pt has been having fever today.  Pt states that she was not feeling well yesterday.  Pt had a cesarean 3 weeks ago and was concerned about cesarean scar being infected.  Scar appears approximated but skin fold is moist.  Pt also reports headache and she is hypertensive.  Pt was prescribed BP meds but she has not began taking them.  Pt is diabetic but out of testing supplies.  Pt is breastfeeding her infant

## 2022-03-12 NOTE — ED Provider Triage Note (Signed)
Emergency Medicine Provider Triage Evaluation Note  Katherine Downs , a 26 y.o. female  was evaluated in triage.  Pt complains of fever, lower abdominal pain, palpitations.  Patient states that 3 weeks ago, she obtained a cesarean section.  1 week ago, she noticed development of cough.  2 days ago, she noticed drainage from the cesarean section incision site.  She had elevated temperature yesterday as well.  Denies feelings of chest pain, shortness of breath, nausea, vomiting, urinary/vaginal symptoms, change in bowel habits.  Patient has taken no medication for said fever.  Patient states she was diagnosed with preeclampsia but was never told so she never took any at home medicines.  Patient reports same headache experienced during diagnosis of preeclampsia has not gotten better.   Review of Systems  Positive: See above Negative:   Physical Exam  BP (S) (!) 144/93 (BP Location: Right Arm) Comment: supposed to be on BP meds but has not began taking, has not picked it up yet  Pulse (!) 133   Temp (!) 102.7 F (39.3 C) (Oral)   Resp (!) 22   Ht 5\' 4"  (1.626 m)   Wt 114.8 kg   LMP 05/24/2021 (Exact Date) Comment: LMP 05/24/21 was heavier than normal, menses 04/10/21 was normal  SpO2 97%   Breastfeeding Yes   BMI 43.43 kg/m  Gen:   Awake, no distress   Resp:  Normal effort  MSK:   Moves extremities without difficulty  Other:  Surgical site without obvious drainage or palpable fluctuance.  Tenderness to palpation along lower cervical incision was made.  No lower extremity edema noted.  Tachycardia with normal rhythm.  Medical Decision Making  Medically screening exam initiated at 6:35 PM.  Appropriate orders placed.  Delva L Upshaw was informed that the remainder of the evaluation will be completed by another provider, this initial triage assessment does not replace that evaluation, and the importance of remaining in the ED until their evaluation is complete.     Katherine Downs,  [place] 03/12/22 1840

## 2022-03-14 LAB — URINE CULTURE: Culture: 20000 — AB

## 2022-03-15 ENCOUNTER — Telehealth (HOSPITAL_BASED_OUTPATIENT_CLINIC_OR_DEPARTMENT_OTHER): Payer: Self-pay | Admitting: *Deleted

## 2022-03-15 NOTE — Telephone Encounter (Signed)
Post ED Visit - Positive Culture Follow-up  Culture report reviewed by antimicrobial stewardship pharmacist: [place] Team []  Elenor Quinones, Pharm.D. []  Heide Guile, Pharm.D., BCPS AQ-ID []  Parks Neptune, Pharm.D., BCPS []  Alycia Rossetti, Pharm.D., BCPS []  [place], [place].D., BCPS, AAHIVP []  Legrand Como, Pharm.D., BCPS, AAHIVP []  Salome Arnt, PharmD, BCPS []  Johnnette Gourd, PharmD, BCPS []  Hughes Better, PharmD, BCPS []  Leeroy Cha, PharmD []  Laqueta Linden, PharmD, BCPS [x]  [place]  [place] Team []  Leodis Sias, PharmD []  Lindell Spar, PharmD []  Royetta Asal, PharmD []  Graylin Shiver, Rph []  Rema Fendt) Glennon Mac, PharmD []  Arlyn Dunning, PharmD []  Netta Cedars, PharmD []  Dia Sitter, PharmD []  Leone Haven, PharmD []  Gretta Arab, PharmD []  Theodis Shove, PharmD []  Peggyann Juba, PharmD []  Reuel Boom, PharmD   Positive urine culture Treated with Amoxicillin-Pot Clavulanate, organism sensitive to the same and no further patient follow-up is required at this time.  [name] 03/15/2022, 11:40 AM

## 2022-03-17 LAB — CULTURE, BLOOD (ROUTINE X 2)
Culture: NO GROWTH
Culture: NO GROWTH
Special Requests: ADEQUATE

## 2022-05-20 ENCOUNTER — Encounter (HOSPITAL_COMMUNITY): Payer: Self-pay

## 2022-05-20 ENCOUNTER — Other Ambulatory Visit: Payer: Self-pay

## 2022-05-20 ENCOUNTER — Ambulatory Visit: Admission: EM | Admit: 2022-05-20 | Payer: No Typology Code available for payment source | Source: Home / Self Care

## 2022-05-20 ENCOUNTER — Emergency Department (HOSPITAL_COMMUNITY)
Admission: EM | Admit: 2022-05-20 | Discharge: 2022-05-20 | Disposition: A | Discharge status: Home / Self Care | Payer: Medicaid Other | Attending: Emergency Medicine | Admitting: Emergency Medicine

## 2022-05-20 DIAGNOSIS — Z20822 Contact with and (suspected) exposure to covid-19: Secondary | ICD-10-CM | POA: Diagnosis not present

## 2022-05-20 DIAGNOSIS — Z7982 Long term (current) use of aspirin: Secondary | ICD-10-CM | POA: Insufficient documentation

## 2022-05-20 DIAGNOSIS — J069 Acute upper respiratory infection, unspecified: Secondary | ICD-10-CM | POA: Diagnosis not present

## 2022-05-20 DIAGNOSIS — R059 Cough, unspecified: Secondary | ICD-10-CM | POA: Diagnosis present

## 2022-05-20 LAB — RESP PANEL BY RT-PCR (RSV, FLU A&B, COVID)  RVPGX2
Influenza A by PCR: NEGATIVE
Influenza B by PCR: NEGATIVE
Resp Syncytial Virus by PCR: NEGATIVE
SARS Coronavirus 2 by RT PCR: NEGATIVE

## 2022-05-20 MED ORDER — FLUTICASONE PROPIONATE 50 MCG/ACT NA SUSP: 2.0000 | Freq: Every day | NASAL | 0 refills | Status: DC

## 2022-05-20 MED ORDER — CETIRIZINE HCL 10 MG PO TABS: 10.0000 mg | ORAL_TABLET | Freq: Every day | ORAL | 0 refills | Status: DC

## 2022-05-20 MED ORDER — ONDANSETRON 4 MG PO TBDP: 4.0000 mg | ORAL_TABLET | Freq: Three times a day (TID) | ORAL | 0 refills | Status: DC | PRN

## 2022-05-20 NOTE — ED Provider Notes (Signed)
[facility] EMERGENCY DEPARTMENT Provider Note   CSN: [id number] Arrival date & time: 05/20/22  1450     History  Chief Complaint  Patient presents with   Cough   Emesis    Katherine Downs is a 26 y.o. female.   Cough Emesis Associated symptoms: cough   Patient is a 26 year old female with no pertinent past medical history presented emergency room today with cough congestion and fatigue for the past 2 or 3 days.  She states that her daughter has had similar symptoms and she is here with the patient in the emergency room as well and tested positive for influenza.  She states that she has had some nausea with 2 episodes of nonbloody nonbilious emesis.  She denies any abdominal pain chest pain or difficulty breathing.  She is coughing frequently no hemoptysis no leg swelling no other new or associated symptoms.  She does describe sinus congestion with sore throat as well.     Home Medications Prior to Admission medications   Medication Sig Start Date End Date Taking? Authorizing Provider  cetirizine (ZYRTEC ALLERGY) 10 MG tablet Take 1 tablet (10 mg total) by mouth daily. 05/20/22  Yes Trentan Trippe S, PA  fluticasone (FLONASE) 50 MCG/ACT nasal spray Place 2 sprays into both nostrils daily for 14 days. 05/20/22 06/03/22 Yes Mayo Owczarzak S, PA  albuterol (VENTOLIN HFA) 108 (90 Base) MCG/ACT inhaler Inhale 2 puffs into the lungs every 4 (four) hours as needed for wheezing or shortness of breath. 06/05/20   Triplett, Johnette Abraham B, FNP  albuterol (VENTOLIN HFA) 108 (90 Base) MCG/ACT inhaler Inhale 2 puffs into the lungs every 6 (six) hours as needed for wheezing or shortness of breath. 02/18/21   Lavonia Drafts, MD  aspirin 81 MG EC tablet Take 1 tablet by mouth daily. 09/15/21   [provider]  Blood Glucose Monitoring Suppl (FIFTY50 GLUCOSE METER 2.0) w/Device KIT Check blood sugar fasting and 1 hour after the first bite of each meal 09/15/21   [provider]  Blood  Pressure Monitoring (BLOOD PRESSURE KIT) KIT Use on upper arm to check BP regularly to monitor for pre-eclampsia,  Report BPs > 140/90. Use code Z34.90 09/15/21   [provider]  famotidine (PEPCID) 20 MG tablet Take 1 tablet (20 mg total) by mouth daily for 15 days. 08/29/21 09/13/21  Blue, Soijett A, PA-C  magnesium oxide (MAG-OX) 400 MG tablet Take by mouth. 09/15/21 09/15/22  [provider]  metoCLOPramide (REGLAN) 5 MG tablet Take by mouth. 09/15/21 09/15/22  [provider]  promethazine (PHENERGAN) 25 MG tablet Take by mouth. 09/15/21   [provider]  sertraline (ZOLOFT) 50 MG tablet Take by mouth. 09/15/21   [provider]      Allergies    Ibuprofen    Review of Systems   Review of Systems  Respiratory:  Positive for cough.   Gastrointestinal:  Positive for vomiting.    Physical Exam Updated Vital Signs BP 134/83   Pulse (!) 102   Temp 98.2 F (36.8 C) (Oral)   Resp 18   Ht _0  (1.626 m)   Wt 111.6 kg   SpO2 100%   BMI 42.23 kg/m  Physical Exam Vitals and nursing note reviewed.  Constitutional:      General: She is not in acute distress. HENT:     Head: Normocephalic and atraumatic.     Nose: Nose normal.     Mouth/Throat:     Mouth: Mucous  membranes are moist.  Eyes:     General: No scleral icterus. Cardiovascular:     Rate and Rhythm: Normal rate and regular rhythm.     Pulses: Normal pulses.     Heart sounds: Normal heart sounds.  Pulmonary:     Effort: Pulmonary effort is normal. No respiratory distress.     Breath sounds: No wheezing.  Abdominal:     Palpations: Abdomen is soft.     Tenderness: There is no abdominal tenderness. There is no guarding or rebound.  Musculoskeletal:     Cervical back: Normal range of motion.     Right lower leg: No edema.     Left lower leg: No edema.  Skin:    General: Skin is warm and dry.     Capillary Refill: Capillary refill takes less than 2 seconds.  Neurological:      Mental Status: She is alert. Mental status is at baseline.  Psychiatric:        Mood and Affect: Mood normal.        Behavior: Behavior normal.     ED Results / Procedures / Treatments   Labs (all labs ordered are listed, but only abnormal results are displayed) Labs Reviewed  RESP PANEL BY RT-PCR (RSV, FLU A&B, COVID)  [id number]    EKG None  Radiology No results found.  Procedures Procedures    Medications Ordered in ED Medications - No data to display  ED Course/ Medical Decision Making/ A&P                           Medical Decision Making  Patient is a 26 year old female with no pertinent past medical history presented emergency room today with cough congestion and fatigue for the past 2 or 3 days.  She states that her daughter has had similar symptoms and she is here with the patient in the emergency room as well and tested positive for influenza.  She states that she has had some nausea with 2 episodes of nonbloody nonbilious emesis.  She denies any abdominal pain chest pain or difficulty breathing.  She is coughing frequently no hemoptysis no leg swelling no other new or associated symptoms.  She does describe sinus congestion with sore throat as well.  PE: unremarkable.  Some cobblestoning in posterior pharynx.   Recommend hydration, Tylenol Motrin and follow-up with PCP.  Return to emergency room for any new or concerning symptoms.  COVID influenza RSV negative however her daughter tested positive for influenza here in the emergency room just now which makes me very suspicious that we have a false negative.  Low suspicion for pneumonia.  Zyrtec Flonase prescribed and having ibuprofen recommendations given.  Final Clinical Impression(s) / ED Diagnoses Final diagnoses:  Viral URI with cough    Rx / DC Orders ED Discharge Orders          Ordered    cetirizine (ZYRTEC ALLERGY) 10 MG tablet  Daily        05/20/22 1741    fluticasone (FLONASE) 50 MCG/ACT nasal  spray  Daily        05/20/22 1742              Tedd Sias, [place] 33/54/56 2563    Lianne Cure, DO 89/37/34 0130

## 2022-05-20 NOTE — Discharge Instructions (Addendum)
Although your test was negative for influenza I suspect since your daughter has influenza and tested positive today that this is also what is causing her symptoms.  I have written you a work note to use if needed  Hydrate, Tylenol and ibuprofen as discussed below, make sure you are drinking plenty of water, Zofran prescriptions for any nausea  You and your daughter can actually use the same dose of Zofran.  However I do recommend that she only take this once every 8 hours as needed for nausea. As you can take this once every 6 hours as needed.  Viral Illness TREATMENT  Treatment is directed at relieving symptoms. There is no cure. Antibiotics are not effective, because the infection is caused by a virus, not by bacteria. Treatment may include:  Increased fluid intake. Sports drinks offer valuable electrolytes, sugars, and fluids.  Breathing heated mist or steam (vaporizer or shower).  Eating chicken soup or other clear broths, and maintaining good nutrition.  Getting plenty of rest.  Using gargles or lozenges for comfort.  Increasing usage of your inhaler if you have asthma.  Return to work when your temperature has returned to normal.  Gargle warm salt water and spit it out for sore throat. Take benadryl to decrease sinus secretions. Continue to alternate between Tylenol and ibuprofen for pain and fever control.  Follow Up: Follow up with your primary care doctor in 5-7 days for recheck of ongoing symptoms.  Return to emergency department for emergent changing or worsening of symptoms.

## 2022-05-20 NOTE — ED Triage Notes (Signed)
Per mom, she has had cough and congestion and been exposed to recent illness. Denies fever.

## 2022-09-20 ENCOUNTER — Emergency Department: Payer: Medicaid Other

## 2022-09-20 ENCOUNTER — Other Ambulatory Visit: Payer: Self-pay

## 2022-09-20 ENCOUNTER — Emergency Department
Admission: EM | Admit: 2022-09-20 | Discharge: 2022-09-20 | Disposition: A | Discharge status: Home / Self Care | Payer: Medicaid Other | Attending: Emergency Medicine | Admitting: Emergency Medicine

## 2022-09-20 DIAGNOSIS — E119 Type 2 diabetes mellitus without complications: Secondary | ICD-10-CM | POA: Diagnosis not present

## 2022-09-20 DIAGNOSIS — J45909 Unspecified asthma, uncomplicated: Secondary | ICD-10-CM | POA: Diagnosis not present

## 2022-09-20 DIAGNOSIS — W108XXA Fall (on) (from) other stairs and steps, initial encounter: Secondary | ICD-10-CM | POA: Insufficient documentation

## 2022-09-20 DIAGNOSIS — S93402A Sprain of unspecified ligament of left ankle, initial encounter: Secondary | ICD-10-CM | POA: Insufficient documentation

## 2022-09-20 DIAGNOSIS — S99912A Unspecified injury of left ankle, initial encounter: Secondary | ICD-10-CM | POA: Diagnosis present

## 2022-09-20 NOTE — ED Triage Notes (Signed)
Pt reports fell down several steps 2 days ago and continues to have pain and swelling to her left ankle. Pt denies head trauma or LOC

## 2022-09-20 NOTE — ED Provider Notes (Signed)
[facility] Provider Note    Event Date/Time   First MD Initiated Contact with Patient 09/20/22 1123     (approximate)  History   Chief Complaint: Fall and Ankle Pain  HPI  Katherine Downs is a 27 y.o. female with a past medical history of asthma, diabetes, gastric reflux, presents to the emergency department after a fall.  According to the patient 2 days ago she was going down the steps when she tripped causing her to fall down several steps.  States she rolled her left ankle.  Has been able to walk on the ankle since but has been hurting and she has noted some swelling and some bruising so she came to the emergency department for evaluation.  Patient denies LOC denies nausea or vomiting denies any other injuries known to the patient.  Physical Exam   Triage Vital Signs: ED Triage Vitals  Enc Vitals Group     BP 09/20/22 1123 132/87     Pulse Rate 09/20/22 1123 90     Resp 09/20/22 1123 18     Temp 09/20/22 1123 98.2 F (36.8 C)     Temp src --      SpO2 09/20/22 1123 96 %     Weight 09/20/22 1116 246 lb 14.6 oz (112 kg)     Height 09/20/22 1116 5\' 4"  (1.626 m)     Head Circumference --      Peak Flow --      Pain Score 09/20/22 1116 8     Pain Loc --      Pain Edu? --      Excl. in GC? --     Most recent vital signs: Vitals:   09/20/22 1123  BP: 132/87  Pulse: 90  Resp: 18  Temp: 98.2 F (36.8 C)  SpO2: 96%    General: Awake, no distress.  CV:  Good peripheral perfusion Resp:  Normal effort.   Abd:  No distention.   Other:  Patient has mild tenderness to palpation of left ankle with mild pain with range of motion of the left ankle.  Possible mild amount of swelling around the ankle.  No obvious ecchymosis.   ED Results / Procedures / Treatments   RADIOLOGY  I have reviewed and interpreted the ankle images.  I do not see any obvious fracture on my evaluation. Radiology has read the x-ray as negative  MEDICATIONS ORDERED IN  ED: Medications - No data to display   IMPRESSION / MDM / ASSESSMENT AND PLAN / ED COURSE  I reviewed the triage vital signs and the nursing notes.  Patient's presentation is most consistent with acute illness / injury with system symptoms.  Patient presents emergency department after a fall 2 days ago with continued left ankle pain.  Does have mild tenderness to palpation of the area with mild swelling.  No obvious ecchymosis seen on my evaluation.  Will obtain x-ray imaging to evaluate for possible fracture.  Suspect likely left ankle sprain.  If x-ray is negative we will place in an air splint and have the patient follow-up with her doctor.  Patient agreeable to plan.  X-rays negative.  Will place an air splint and discharge the patient with orthopedic follow-up if needed.  Patient agreeable to plan of care.  FINAL CLINICAL IMPRESSION(S) / ED DIAGNOSES   Left ankle sprain   Note:  This document was prepared using Dragon voice recognition software and may include unintentional dictation errors.  Minna Antis, MD 09/20/22 1150

## 2023-02-09 ENCOUNTER — Emergency Department
Admission: EM | Admit: 2023-02-09 | Discharge: 2023-02-09 | Disposition: A | Discharge status: Home / Self Care | Payer: Medicaid Other | Attending: Emergency Medicine | Admitting: Emergency Medicine

## 2023-02-09 ENCOUNTER — Emergency Department: Payer: Medicaid Other

## 2023-02-09 ENCOUNTER — Other Ambulatory Visit: Payer: Self-pay

## 2023-02-09 DIAGNOSIS — E119 Type 2 diabetes mellitus without complications: Secondary | ICD-10-CM | POA: Diagnosis not present

## 2023-02-09 DIAGNOSIS — Y92009 Unspecified place in unspecified non-institutional (private) residence as the place of occurrence of the external cause: Secondary | ICD-10-CM | POA: Insufficient documentation

## 2023-02-09 DIAGNOSIS — X822XXA Intentional collision of motor vehicle with tree, initial encounter: Secondary | ICD-10-CM | POA: Diagnosis not present

## 2023-02-09 DIAGNOSIS — J45909 Unspecified asthma, uncomplicated: Secondary | ICD-10-CM | POA: Diagnosis not present

## 2023-02-09 DIAGNOSIS — M542 Cervicalgia: Secondary | ICD-10-CM | POA: Diagnosis present

## 2023-02-09 DIAGNOSIS — R519 Headache, unspecified: Secondary | ICD-10-CM | POA: Insufficient documentation

## 2023-02-09 DIAGNOSIS — S12691A Other nondisplaced fracture of seventh cervical vertebra, initial encounter for closed fracture: Secondary | ICD-10-CM | POA: Insufficient documentation

## 2023-02-09 DIAGNOSIS — S129XXA Fracture of neck, unspecified, initial encounter: Secondary | ICD-10-CM

## 2023-02-09 LAB — CBG MONITORING, ED: Glucose-Capillary: 154 mg/dL — ABNORMAL HIGH (ref 70–99)

## 2023-02-09 MED ORDER — ACETAMINOPHEN 500 MG PO TABS
1000.0000 mg | ORAL_TABLET | Freq: Once | ORAL | Status: AC
  Administered 2023-02-09: 1000 mg via ORAL
  Filled 2023-02-09: qty 2
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
## 2023-02-22 ENCOUNTER — Ambulatory Visit
Admission: RE | Admit: 2023-02-22 | Discharge: 2023-02-22 | Disposition: A | Discharge status: Home / Self Care | Payer: Medicaid Other | Source: Ambulatory Visit | Attending: Orthopedic Surgery | Admitting: Orthopedic Surgery

## 2023-02-22 ENCOUNTER — Other Ambulatory Visit: Payer: Self-pay | Admitting: Orthopedic Surgery

## 2023-02-22 DIAGNOSIS — S12600A Unspecified displaced fracture of seventh cervical vertebra, initial encounter for closed fracture: Secondary | ICD-10-CM

## 2023-02-23 ENCOUNTER — Ambulatory Visit (INDEPENDENT_AMBULATORY_CARE_PROVIDER_SITE_OTHER): Payer: Medicaid Other | Admitting: Orthopedic Surgery

## 2023-02-23 ENCOUNTER — Encounter: Payer: Self-pay | Admitting: Orthopedic Surgery

## 2023-02-23 VITALS — BP 128/80 | Ht 64.0 in | Wt 250.8 lb

## 2023-02-23 DIAGNOSIS — S12600A Unspecified displaced fracture of seventh cervical vertebra, initial encounter for closed fracture: Secondary | ICD-10-CM | POA: Diagnosis not present

## 2023-02-23 MED ORDER — METHOCARBAMOL 500 MG PO TABS: 500.0000 mg | ORAL_TABLET | Freq: Three times a day (TID) | ORAL | 0 refills | Status: DC | PRN

## 2023-02-23 MED ORDER — HYDROCODONE-ACETAMINOPHEN 5-325 MG PO TABS: 1.0000 | ORAL_TABLET | Freq: Two times a day (BID) | ORAL | 0 refills | Status: AC | PRN

## 2023-02-23 NOTE — Patient Instructions (Signed)
It was so nice to see you today. Thank you so much for coming in.    Your xrays from today look good, but I want to see what the radiologist says. I will message you.   I also sent a prescription for methocarbamol to help with muscle spasms. Use only as needed and be careful, this can make you sleepy.   I also sent a prescription for hydrocodone to use only as needed for severe pain. This can make you sleepy and/or constipated. You cannot drive while taking this medication.   I gave you a note for work. No lifting over 10 pounds.   I will see you back in 6 weeks. Please do not hesitate to call if you have any questions or concerns. You can also message me in MyChart.   Drake Leach PA-C [phone]

## 2023-02-26 ENCOUNTER — Encounter: Payer: Self-pay | Admitting: Orthopedic Surgery

## 2023-02-26 NOTE — Telephone Encounter (Signed)
Cervical xrays dated 02/22/23:  FINDINGS: There is no evidence of cervical spine fracture or prevertebral soft tissue swelling. Alignment is normal. No other significant bone abnormalities are identified. No motion with flexion and extension to suggest instability.   IMPRESSION: Negative cervical spine radiographs. C7 transverse fracture discussed on prior CT is not evident. CT follow up should be considered.     Electronically Signed   By: Layla Maw M.D.   On: 02/26/2023 11:28   I have personally reviewed the images and agree with the above interpretation.   Patient sent MyChart message. No change to plan.
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
## 2023-04-06 ENCOUNTER — Ambulatory Visit: Payer: Medicaid Other | Admitting: Orthopedic Surgery

## 2023-04-06 DIAGNOSIS — S12600D Unspecified displaced fracture of seventh cervical vertebra, subsequent encounter for fracture with routine healing: Secondary | ICD-10-CM

## 2023-10-14 ENCOUNTER — Ambulatory Visit: Payer: Self-pay

## 2023-10-21 ENCOUNTER — Ambulatory Visit: Admission: EM | Admit: 2023-10-21 | Discharge: 2023-10-21 | Disposition: A | Discharge status: Home / Self Care

## 2023-10-21 ENCOUNTER — Ambulatory Visit (HOSPITAL_COMMUNITY)
Admission: RE | Admit: 2023-10-21 | Discharge: 2023-10-21 | Disposition: A | Discharge status: Home / Self Care | Source: Ambulatory Visit | Attending: Nurse Practitioner | Admitting: Nurse Practitioner

## 2023-10-21 DIAGNOSIS — M545 Low back pain, unspecified: Secondary | ICD-10-CM

## 2023-10-21 DIAGNOSIS — M542 Cervicalgia: Secondary | ICD-10-CM | POA: Insufficient documentation

## 2023-10-21 MED ORDER — CYCLOBENZAPRINE HCL 5 MG PO TABS: 5.0000 mg | ORAL_TABLET | Freq: Three times a day (TID) | ORAL | 0 refills | Status: DC | PRN
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
## 2023-10-21 NOTE — Discharge Instructions (Signed)
 Go to [facility] for an x-ray of your neck.  You will need to go to the main entrance of the hospital get to the radiology department.  You will be contacted if the results of the x-ray are abnormal. Take medication as prescribed. Increase fluids and allow for plenty of rest. You may take over-the-counter Tylenol  or ibuprofen as needed for pain, fever, or general discomfort. Recommend use of heat to help with pain or stiffness.  Apply for 20 minutes, remove for 1 hour, repeat as needed. Perform gentle stretching and range of motion exercises at least 2-3 times daily while symptoms persist. Go to the emergency department immediately if you experience numbness, tingling, difficulty swallowing, or difficulty breathing. If your x-ray is negative and you are continue to experience symptoms, recommend following up with neurosurgery or with orthopedics for further evaluation. Follow-up as needed.

## 2023-10-25 ENCOUNTER — Ambulatory Visit (HOSPITAL_COMMUNITY): Payer: Self-pay

## 2023-11-10 ENCOUNTER — Ambulatory Visit
Admission: EM | Admit: 2023-11-10 | Discharge: 2023-11-10 | Disposition: A | Discharge status: Home / Self Care | Attending: Family Medicine | Admitting: Family Medicine

## 2023-11-10 DIAGNOSIS — J0300 Acute streptococcal tonsillitis, unspecified: Secondary | ICD-10-CM

## 2023-11-10 LAB — POCT RAPID STREP A (OFFICE): Rapid Strep A Screen: POSITIVE — AB

## 2023-11-10 MED ORDER — LIDOCAINE VISCOUS HCL 2 % MT SOLN: 10.0000 mL | OROMUCOSAL | 0 refills | Status: DC | PRN

## 2023-11-10 MED ORDER — AMOXICILLIN 875 MG PO TABS: 875.0000 mg | ORAL_TABLET | Freq: Two times a day (BID) | ORAL | 0 refills | Status: DC

## 2023-11-10 NOTE — ED Triage Notes (Signed)
 Pt presenst to UC for c/o sore and swollen throat, left ear pain x2 days.
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
## 2023-11-25 ENCOUNTER — Emergency Department (HOSPITAL_COMMUNITY)
Admission: EM | Admit: 2023-11-25 | Discharge: 2023-11-26 | Disposition: A | Discharge status: Home / Self Care | Attending: Emergency Medicine | Admitting: Emergency Medicine

## 2023-11-25 ENCOUNTER — Encounter (HOSPITAL_COMMUNITY): Payer: Self-pay | Admitting: Emergency Medicine

## 2023-11-25 ENCOUNTER — Other Ambulatory Visit: Payer: Self-pay

## 2023-11-25 DIAGNOSIS — R739 Hyperglycemia, unspecified: Secondary | ICD-10-CM

## 2023-11-25 DIAGNOSIS — E1165 Type 2 diabetes mellitus with hyperglycemia: Secondary | ICD-10-CM | POA: Insufficient documentation

## 2023-11-25 DIAGNOSIS — Z7984 Long term (current) use of oral hypoglycemic drugs: Secondary | ICD-10-CM | POA: Insufficient documentation

## 2023-11-25 LAB — COMPREHENSIVE METABOLIC PANEL WITH GFR
ALT: 23 U/L (ref 0–44)
AST: 18 U/L (ref 15–41)
Albumin: 3.9 g/dL (ref 3.5–5.0)
Alkaline Phosphatase: 49 U/L (ref 38–126)
Anion gap: 10 (ref 5–15)
BUN: 12 mg/dL (ref 6–20)
CO2: 24 mmol/L (ref 22–32)
Calcium: 9.3 mg/dL (ref 8.9–10.3)
Chloride: 101 mmol/L (ref 98–111)
Creatinine, Ser: 0.59 mg/dL (ref 0.44–1.00)
GFR, Estimated: >60 mL/min (ref >60)
Glucose, Bld: 338 mg/dL — ABNORMAL HIGH (ref 70–99)
Potassium: 3.7 mmol/L (ref 3.5–5.1)
Sodium: 135 mmol/L (ref 135–145)
Total Bilirubin: 0.5 mg/dL (ref 0.0–1.2)
Total Protein: 7.8 g/dL (ref 6.5–8.1)

## 2023-11-25 LAB — CBC
HCT: 38.1 % (ref 36.0–46.0)
Hemoglobin: 12.3 g/dL (ref 12.0–15.0)
MCH: 28.4 pg (ref 26.0–34.0)
MCHC: 32.3 g/dL (ref 30.0–36.0)
MCV: 88 fL (ref 80.0–100.0)
Platelets: 424 10*3/uL — ABNORMAL HIGH (ref 150–400)
RBC: 4.33 MIL/uL (ref 3.87–5.11)
RDW: 13.6 % (ref 11.5–15.5)
WBC: 11.2 10*3/uL — ABNORMAL HIGH (ref 4.0–10.5)
nRBC: 0 % (ref 0.0–0.2)

## 2023-11-25 LAB — CBG MONITORING, ED: Glucose-Capillary: 323 mg/dL — ABNORMAL HIGH (ref 70–99)

## 2023-11-25 MED ORDER — INSULIN ASPART 100 UNIT/ML IJ SOLN
10.0000 [IU] | Freq: Once | INTRAMUSCULAR | Status: AC
  Administered 2023-11-26: 10 [IU] via SUBCUTANEOUS
  Filled 2023-11-25: qty 1

## 2023-11-25 NOTE — ED Notes (Signed)
 ED Provider at bedside.

## 2023-11-25 NOTE — ED Notes (Signed)
Pt ambulated to the bathroom unassisted.  

## 2023-11-25 NOTE — ED Notes (Signed)
 Lab at bedside

## 2023-11-25 NOTE — ED Notes (Signed)
 Pt ambulated to the bathroom.

## 2023-11-25 NOTE — ED Triage Notes (Addendum)
 Pt to ed via [facility] c/o hyperglycemia. Per ems CBG 303. Pt has has not been able to get her diabetic medication for a week from the pharmacy. NAD at this time.

## 2023-11-25 NOTE — ED Provider Notes (Signed)
 [place] EMERGENCY DEPARTMENT AT [facility]  Provider Note  CSN: [id number] Arrival date & time: 11/25/23 2104  History Chief Complaint  Patient presents with   Hyperglycemia    Katherine Downs is a 28 y.o. female with history of DM here for high sugars, prescribed metformin and Ozempic. She reports her pharmacy has not been able to fill her Ozempic for the last 2 weeks and she does not take her Metformin as prescribed due to side effects. She has tried calling her PCP but has not gotten much advice from them. She otherwise feels at her baseline.    Home Medications Prior to Admission medications   Medication Sig Start Date End Date Taking? Authorizing Provider  metFORMIN (GLUCOPHAGE-XR) 500 MG 24 hr tablet Take 500 mg by mouth 2 (two) times daily. 10/14/23  Yes [provider]  Semaglutide (OZEMPIC, 0.25 OR 0.5 MG/DOSE, [place]) Inject into the skin once a week.   Yes [provider]  albuterol  (VENTOLIN  HFA) 108 (90 Base) MCG/ACT inhaler Inhale 2 puffs into the lungs every 4 (four) hours as needed for wheezing or shortness of breath. 06/05/20   Triplett, Kirk B, FNP  albuterol  (VENTOLIN  HFA) 108 (90 Base) MCG/ACT inhaler Inhale 2 puffs into the lungs every 6 (six) hours as needed for wheezing or shortness of breath. 02/18/21   Arlander Charleston, MD  amoxicillin  (AMOXIL ) 875 MG tablet Take 1 tablet (875 mg total) by mouth 2 (two) times daily. 11/10/23   Stuart Vernell Norris, PA-C  Blood Glucose Monitoring Suppl (FIFTY50 GLUCOSE METER 2.0) w/Device KIT Check blood sugar fasting and 1 hour after the first bite of each meal 09/15/21   [provider]  Blood Pressure Monitoring (BLOOD PRESSURE KIT) KIT Use on upper arm to check BP regularly to monitor for pre-eclampsia,  Report BPs > 140/90. Use code Z34.90 09/15/21   [provider]  cyclobenzaprine  (FLEXERIL ) 5 MG tablet Take 1 tablet (5 mg total) by mouth 3 (three) times daily as needed for muscle spasms.  10/21/23   Leath-Warren, Etta PARAS, NP  lidocaine  (XYLOCAINE ) 2 % solution Use as directed 10 mLs in the mouth or throat every 3 (three) hours as needed. 11/10/23   Stuart Vernell Norris, PA-C  methocarbamol  (ROBAXIN ) 500 MG tablet Take 1 tablet (500 mg total) by mouth every 8 (eight) hours as needed for muscle spasms. 02/23/23   Hilma Hastings, PA-C     Allergies    Ibuprofen   Review of Systems   Review of Systems Please see HPI for pertinent positives and negatives  Physical Exam BP (!) 128/100 (BP Location: Right Arm)   Pulse 99   Temp 98.2 F (36.8 C) (Oral)   Resp 18   Ht 5' 4 (1.626 m)   Wt 114.8 kg   SpO2 98%   BMI 43.43 kg/m   Physical Exam Vitals and nursing note reviewed.  Constitutional:      Appearance: Normal appearance.  HENT:     Head: Normocephalic and atraumatic.     Nose: Nose normal.     Mouth/Throat:     Mouth: Mucous membranes are moist.  Eyes:     Extraocular Movements: Extraocular movements intact.     Conjunctiva/sclera: Conjunctivae normal.  Cardiovascular:     Rate and Rhythm: Normal rate.  Pulmonary:     Effort: Pulmonary effort is normal.     Breath sounds: Normal breath sounds.  Abdominal:     General: Abdomen is flat.  Palpations: Abdomen is soft.     Tenderness: There is no abdominal tenderness.  Musculoskeletal:        General: No swelling. Normal range of motion.     Cervical back: Neck supple.  Skin:    General: Skin is warm and dry.  Neurological:     General: No focal deficit present.     Mental Status: She is alert.  Psychiatric:        Mood and Affect: Mood normal.     ED Results / Procedures / Treatments   EKG None  Procedures Procedures  Medications Ordered in the ED Medications  insulin aspart (novoLOG) injection 10 Units (has no administration in time range)    Initial Impression and Plan  Patient with known DM here for hyperglycemia in setting of difficulty with getting or tolerating her meds. Labs  done in triage show unremarkable CBC. BMP with mild hyperglycemia but no acidosis. She is request 'something for the sugar' so she can go home. Will give a dose of insulin here, recommend she monitor sugar through the next few hours and to take her Metformin as prescribed until she can get the Ozempic filled and/or discuss alternative treatment regimens with her PCP. RTED for any other concerns.   ED Course       MDM Rules/Calculators/A&P Medical Decision Making Problems Addressed: Hyperglycemia: acute illness or injury  Amount and/or Complexity of Data Reviewed Labs: ordered. Decision-making details documented in ED Course.  Risk Prescription drug management.     Final Clinical Impression(s) / ED Diagnoses Final diagnoses:  Hyperglycemia    Rx / DC Orders ED Discharge Orders     None        Roselyn Carlin NOVAK, MD 11/26/23 0003

## 2024-02-04 ENCOUNTER — Emergency Department (HOSPITAL_COMMUNITY)
Admission: EM | Admit: 2024-02-04 | Discharge: 2024-02-04 | Disposition: A | Discharge status: Home / Self Care | Attending: Emergency Medicine | Admitting: Emergency Medicine

## 2024-02-04 ENCOUNTER — Emergency Department (HOSPITAL_COMMUNITY)

## 2024-02-04 DIAGNOSIS — R0789 Other chest pain: Secondary | ICD-10-CM | POA: Insufficient documentation

## 2024-02-04 DIAGNOSIS — Z7984 Long term (current) use of oral hypoglycemic drugs: Secondary | ICD-10-CM | POA: Diagnosis not present

## 2024-02-04 DIAGNOSIS — I10 Essential (primary) hypertension: Secondary | ICD-10-CM | POA: Diagnosis not present

## 2024-02-04 DIAGNOSIS — R002 Palpitations: Secondary | ICD-10-CM | POA: Diagnosis not present

## 2024-02-04 DIAGNOSIS — E119 Type 2 diabetes mellitus without complications: Secondary | ICD-10-CM | POA: Diagnosis not present

## 2024-02-04 LAB — COMPREHENSIVE METABOLIC PANEL WITH GFR
ALT: 17 U/L (ref 0–44)
AST: 16 U/L (ref 15–41)
Albumin: 3.9 g/dL (ref 3.5–5.0)
Alkaline Phosphatase: 48 U/L (ref 38–126)
Anion gap: 9 (ref 5–15)
BUN: 8 mg/dL (ref 6–20)
CO2: 24 mmol/L (ref 22–32)
Calcium: 8.8 mg/dL — ABNORMAL LOW (ref 8.9–10.3)
Chloride: 104 mmol/L (ref 98–111)
Creatinine, Ser: 0.67 mg/dL (ref 0.44–1.00)
GFR, Estimated: >60 mL/min (ref >60)
Glucose, Bld: 113 mg/dL — ABNORMAL HIGH (ref 70–99)
Potassium: 3.1 mmol/L — ABNORMAL LOW (ref 3.5–5.1)
Sodium: 137 mmol/L (ref 135–145)
Total Bilirubin: 0.7 mg/dL (ref 0.0–1.2)
Total Protein: 7.8 g/dL (ref 6.5–8.1)

## 2024-02-04 LAB — CBC WITH DIFFERENTIAL/PLATELET
Abs Immature Granulocytes: 0.02 K/uL (ref 0.00–0.07)
Basophils Absolute: 0 K/uL (ref 0.0–0.1)
Basophils Relative: 0 %
Eosinophils Absolute: 0.1 K/uL (ref 0.0–0.5)
Eosinophils Relative: 1 %
HCT: 36.5 % (ref 36.0–46.0)
Hemoglobin: 12.2 g/dL (ref 12.0–15.0)
Immature Granulocytes: 0 %
Lymphocytes Relative: 37 %
Lymphs Abs: 3.3 K/uL (ref 0.7–4.0)
MCH: 30 pg (ref 26.0–34.0)
MCHC: 33.4 g/dL (ref 30.0–36.0)
MCV: 89.7 fL (ref 80.0–100.0)
Monocytes Absolute: 0.4 K/uL (ref 0.1–1.0)
Monocytes Relative: 5 %
Neutro Abs: 5 K/uL (ref 1.7–7.7)
Neutrophils Relative %: 57 %
Platelets: 420 K/uL — ABNORMAL HIGH (ref 150–400)
RBC: 4.07 MIL/uL (ref 3.87–5.11)
RDW: 13 % (ref 11.5–15.5)
WBC: 8.9 K/uL (ref 4.0–10.5)
nRBC: 0 % (ref 0.0–0.2)

## 2024-02-04 LAB — HCG, QUANTITATIVE, PREGNANCY: hCG, Beta Chain, Quant, S: <1 m[IU]/mL (ref <5)

## 2024-02-04 LAB — TROPONIN I (HIGH SENSITIVITY)
Troponin I (High Sensitivity): <2 ng/L (ref <18)
Troponin I (High Sensitivity): <2 ng/L (ref <18)

## 2024-02-04 LAB — D-DIMER, QUANTITATIVE: D-Dimer, Quant: 0.33 ug{FEU}/mL (ref 0.00–0.50)

## 2024-02-04 MED ORDER — TRAMADOL HCL 50 MG PO TABS: ORAL_TABLET | ORAL | 0 refills | Status: DC

## 2024-02-04 NOTE — Discharge Instructions (Addendum)
 Follow-up with a cardiologist in the next 2 to 3 weeks.  Return if problems

## 2024-02-04 NOTE — ED Triage Notes (Signed)
 Pt comes in for CP. CP started Monday and has worsen over time. Pt states it comes and goes... I feel my heart fluttering and I can't breathe. Pt has no known cardiac or pulmonary issues. Pt used to have HTN but no longer has it and is not on meds for HTN. A&Ox4. Pt was able to walk to triage with no issues. CP is in center of the chest.

## 2024-02-05 NOTE — ED Provider Notes (Signed)
 [place] EMERGENCY DEPARTMENT AT [facility] Provider Note   CSN: [id number] Arrival date & time: 02/04/24  1437     Patient presents with: Chest Pain   Katherine Downs is a 28 y.o. female.   Patient complains of palpitations and chest pain.  Patient has a history of diabetes  The history is provided by the patient and medical records. No language interpreter was used.  Chest Pain Pain location:  L chest Pain quality: aching   Pain radiates to:  Does not radiate Pain severity:  Mild Onset quality:  Sudden Timing:  Intermittent Progression:  Waxing and waning Chronicity:  New Context: not breathing   Relieved by:  Nothing Associated symptoms: palpitations   Associated symptoms: no abdominal pain, no back pain, no cough, no fatigue and no headache        Prior to Admission medications   Medication Sig Start Date End Date Taking? Authorizing Provider  metFORMIN (GLUCOPHAGE-XR) 500 MG 24 hr tablet Take 500 mg by mouth 2 (two) times daily. 10/14/23  Yes [provider]  OZEMPIC, 2 MG/DOSE, 8 MG/3ML SOPN Inject 2 mg into the skin every Monday. 02/03/24  Yes [provider]  traMADol  (ULTRAM ) 50 MG tablet Take 1 every 6 hours for pain not relieved by Tylenol  alone 02/04/24  Yes Suzette Pac, MD  Blood Pressure Monitoring (BLOOD PRESSURE KIT) KIT Use on upper arm to check BP regularly to monitor for pre-eclampsia,  Report BPs > 140/90. Use code Z34.90 09/15/21   [provider]    Allergies: Ibuprofen    Review of Systems  Constitutional:  Negative for appetite change and fatigue.  HENT:  Negative for congestion, ear discharge and sinus pressure.   Eyes:  Negative for discharge.  Respiratory:  Negative for cough.   Cardiovascular:  Positive for chest pain and palpitations.  Gastrointestinal:  Negative for abdominal pain and diarrhea.  Genitourinary:  Negative for frequency and hematuria.  Musculoskeletal:  Negative for back pain.  Skin:   Negative for rash.  Neurological:  Negative for seizures and headaches.  Psychiatric/Behavioral:  Negative for hallucinations.     Updated Vital Signs BP (!) 158/91   Pulse 89   Temp 98.4 F (36.9 C) (Oral)   Resp 20   Ht 5' 4 (1.626 m)   Wt 110.2 kg   SpO2 97%   BMI 41.71 kg/m   Physical Exam Vitals and nursing note reviewed.  Constitutional:      Appearance: She is well-developed.  HENT:     Head: Normocephalic.     Nose: Nose normal.  Eyes:     General: No scleral icterus.    Conjunctiva/sclera: Conjunctivae normal.  Neck:     Thyroid: No thyromegaly.  Cardiovascular:     Rate and Rhythm: Normal rate and regular rhythm.     Heart sounds: No murmur heard.    No friction rub. No gallop.  Pulmonary:     Breath sounds: No stridor. No wheezing or rales.  Chest:     Chest wall: No tenderness.  Abdominal:     General: There is no distension.     Tenderness: There is no abdominal tenderness. There is no rebound.  Musculoskeletal:        General: Normal range of motion.     Cervical back: Neck supple.  Lymphadenopathy:     Cervical: No cervical adenopathy.  Skin:    Findings: No erythema or rash.  Neurological:     Mental Status:  She is alert and oriented to person, place, and time.     Motor: No abnormal muscle tone.     Coordination: Coordination normal.  Psychiatric:        Behavior: Behavior normal.     (all labs ordered are listed, but only abnormal results are displayed) Labs Reviewed  CBC WITH DIFFERENTIAL/PLATELET - Abnormal; Notable for the following components:      Result Value   Platelets 420 (*)    All other components within normal limits  COMPREHENSIVE METABOLIC PANEL WITH GFR - Abnormal; Notable for the following components:   Potassium 3.1 (*)    Glucose, Bld 113 (*)    Calcium  8.8 (*)    All other components within normal limits  D-DIMER, QUANTITATIVE  HCG, QUANTITATIVE, PREGNANCY  TROPONIN I (HIGH SENSITIVITY)  TROPONIN I (HIGH  SENSITIVITY)    EKG: EKG Interpretation Date/Time:  Friday February 04 2024 14:50:20 EDT Ventricular Rate:  91 PR Interval:  150 QRS Duration:  86 QT Interval:  338 QTC Calculation: 415 R Axis:   70  Text Interpretation: Normal sinus rhythm Nonspecific T wave abnormality Abnormal ECG When compared with ECG of 12-Mar-2022 20:12, No significant change since last tracing Confirmed by Towana Sharper [phone]) on 02/04/2024 2:55:27 PM  Radiology: DG Chest 2 View Result Date: 02/04/2024 CLINICAL DATA:  Chest pain. EXAM: CHEST - 2 VIEW COMPARISON:  March 12, 2022. FINDINGS: The heart size and mediastinal contours are within normal limits. Both lungs are clear. The visualized skeletal structures are unremarkable. IMPRESSION: No active cardiopulmonary disease. Electronically Signed   By: Lynwood Landy Raddle M.D.   On: 02/04/2024 16:47     Procedures   Medications Ordered in the ED - No data to display                                  Medical Decision Making Amount and/or Complexity of Data Reviewed Labs: ordered. Radiology: ordered.  Risk Prescription drug management.   Patient with palpitations and atypical chest pain.  She is given some tramadol  and will follow-up with cardiology     Final diagnoses:  Atypical chest pain  Palpitations    ED Discharge Orders          Ordered    traMADol  (ULTRAM ) 50 MG tablet        02/04/24 2012    Ambulatory referral to Cardiology       Comments: If you have not heard from the Cardiology office within the next 72 hours please call [phone].   02/04/24 2013               Suzette Pac, MD 02/05/24 1049

## 2024-02-29 ENCOUNTER — Emergency Department (HOSPITAL_COMMUNITY)
Admission: EM | Admit: 2024-02-29 | Discharge: 2024-02-29 | Disposition: A | Discharge status: Home / Self Care | Attending: Emergency Medicine | Admitting: Emergency Medicine

## 2024-02-29 ENCOUNTER — Emergency Department (HOSPITAL_COMMUNITY)

## 2024-02-29 ENCOUNTER — Other Ambulatory Visit: Payer: Self-pay

## 2024-02-29 ENCOUNTER — Encounter (HOSPITAL_COMMUNITY): Payer: Self-pay

## 2024-02-29 DIAGNOSIS — Z794 Long term (current) use of insulin: Secondary | ICD-10-CM | POA: Insufficient documentation

## 2024-02-29 DIAGNOSIS — E119 Type 2 diabetes mellitus without complications: Secondary | ICD-10-CM | POA: Insufficient documentation

## 2024-02-29 DIAGNOSIS — W010XXA Fall on same level from slipping, tripping and stumbling without subsequent striking against object, initial encounter: Secondary | ICD-10-CM | POA: Diagnosis not present

## 2024-02-29 DIAGNOSIS — Y9301 Activity, walking, marching and hiking: Secondary | ICD-10-CM | POA: Diagnosis not present

## 2024-02-29 DIAGNOSIS — Z7984 Long term (current) use of oral hypoglycemic drugs: Secondary | ICD-10-CM | POA: Insufficient documentation

## 2024-02-29 DIAGNOSIS — Y92009 Unspecified place in unspecified non-institutional (private) residence as the place of occurrence of the external cause: Secondary | ICD-10-CM | POA: Insufficient documentation

## 2024-02-29 DIAGNOSIS — M25531 Pain in right wrist: Secondary | ICD-10-CM | POA: Diagnosis not present

## 2024-02-29 DIAGNOSIS — S93401A Sprain of unspecified ligament of right ankle, initial encounter: Secondary | ICD-10-CM

## 2024-02-29 DIAGNOSIS — M25551 Pain in right hip: Secondary | ICD-10-CM | POA: Insufficient documentation

## 2024-02-29 DIAGNOSIS — M25571 Pain in right ankle and joints of right foot: Secondary | ICD-10-CM | POA: Insufficient documentation

## 2024-02-29 MED ORDER — FENTANYL CITRATE (PF) 100 MCG/2ML IJ SOLN
50.0000 ug | Freq: Once | INTRAMUSCULAR | Status: AC
  Administered 2024-02-29: 50 ug via INTRAMUSCULAR
  Filled 2024-02-29: qty 2

## 2024-02-29 MED ORDER — HYDROCODONE-ACETAMINOPHEN 5-325 MG PO TABS: 1.0000 | ORAL_TABLET | Freq: Four times a day (QID) | ORAL | 0 refills | Status: AC | PRN

## 2024-02-29 MED ORDER — ACETAMINOPHEN 500 MG PO TABS
1000.0000 mg | ORAL_TABLET | Freq: Once | ORAL | Status: AC
  Administered 2024-02-29: 1000 mg via ORAL
  Filled 2024-02-29: qty 2

## 2024-02-29 NOTE — ED Triage Notes (Addendum)
 Patient BIB [facility] for complaint of Fall. Patient walking out of house tripped over [place] box, and had a twisted fall landing on right hip and hands. Pain generalized pain. Angulation noted to right ankle, [facility] splinted ankle for immobilization. Patient denise Blood thinners, and denise hitting her head.

## 2024-02-29 NOTE — Discharge Instructions (Addendum)
 It was a pleasure taking care of you today.  You are seen for injuries after fall today.  You having pain in your right ankle, there is no sign of fracture or dislocation fortunately.  Left hip sprain.  Continue using the walking boot, limit your walking, especially since you have declined crutches.  Keep your foot elevated is much as possible to help with swelling, use ice for 15 minutes at a time several times a day.  You are prescribed a small quantity of hydrocodone  as needed for pain since you cannot take NSAIDs.  You can also take Tylenol  though there is a small amount of Tylenol  in the Norco you are prescribed so be sure to not take more than 3000 mg a day total.  Follow-up with orthopedics.  He also have some pain in your wrist likely a sprain.  We gave you a brace to wear and you follow-up with orthopedics for this as well.  Your x-rays did not show any evidence of fracture or dislocation.  You were prescribed opiate pain medication. This can have many sided effect such as drowsiness, slowed breathing, and conspitation. Do not take it with other medications that cause drowsiness, or drink alcohol while taking it. Take a stool softener over the counter while taking it.  Do not drive or operate machinery when taking this.

## 2024-02-29 NOTE — ED Provider Notes (Signed)
  EMERGENCY DEPARTMENT AT [facility] Provider Note   CSN: [id number] Arrival date & time: 02/29/24  9262     Patient presents with: Katherine Downs is a 28 y.o. female.  She has history of type 2 diabetes.  Presents ER for pain in her right ankle and right wrist after a fall.  She tripped over a package when leaving her house onto her right ankle and right outstretched hand and was having pain in both areas she reports a very mild hip pain but states she can move her hip well and this is not affecting her very much.  Denies hematuria or loss of consciousness.  She is not on blood thinners.  Pain is worse when trying to move her ankle, improves with rest.    Fall       Prior to Admission medications   Medication Sig Start Date End Date Taking? Authorizing Provider  OZEMPIC, 2 MG/DOSE, 8 MG/3ML SOPN Inject 2 mg into the skin every Monday. 02/03/24  Yes [provider]  Blood Pressure Monitoring (BLOOD PRESSURE KIT) KIT Use on upper arm to check BP regularly to monitor for pre-eclampsia,  Report BPs > 140/90. Use code Z34.90 09/15/21   [provider]  metFORMIN (GLUCOPHAGE-XR) 500 MG 24 hr tablet Take 500 mg by mouth 2 (two) times daily. 10/14/23   [provider]    Allergies: Ibuprofen    Review of Systems  Updated Vital Signs BP (!) 132/96 (BP Location: Left Arm)   Pulse 92   Temp 98.9 F (37.2 C) (Oral)   Resp 18   Ht 5' 4 (1.626 m)   Wt 110.2 kg   SpO2 97%   BMI 41.71 kg/m   Physical Exam Vitals and nursing note reviewed.  Constitutional:      General: She is not in acute distress.    Appearance: She is well-developed.  HENT:     Head: Normocephalic and atraumatic.  Eyes:     Conjunctiva/sclera: Conjunctivae normal.  Cardiovascular:     Rate and Rhythm: Normal rate and regular rhythm.     Heart sounds: No murmur heard. Pulmonary:     Effort: Pulmonary effort is normal. No respiratory distress.     Breath  sounds: Normal breath sounds.  Abdominal:     Palpations: Abdomen is soft.     Tenderness: There is no abdominal tenderness.  Musculoskeletal:     Cervical back: Neck supple. No tenderness.     Comments: There is tenderness to the lateral malleolus of the right ankle with localized swelling here.  No obvious deformity.  DP and PT pulses are intact in the right foot.  Mild tenderness of midshaft of right, no tenderness to the knee or limited range of motion of knee or hip on the right.  Tenderness diffusely to the dorsum of the right wrist with no snuffbox tenderness.  No proximal forearm or elbow or shoulder tenderness on the right.  Normal range of motion of the right elbow and shoulder.  Radial pulse on the right is intact.  Skin:    General: Skin is warm and dry.     Capillary Refill: Capillary refill takes less than 2 seconds.  Neurological:     General: No focal deficit present.     Mental Status: She is alert and oriented to person, place, and time.     Gait: Gait normal.  Psychiatric:        Mood and Affect:  Mood normal.     (all labs ordered are listed, but only abnormal results are displayed) Labs Reviewed - No data to display  EKG: None  Radiology: DG Wrist Complete Right Result Date: 02/29/2024 EXAM: 4 VIEW(S) XRAY OF THE RIGHT WRIST 02/29/2024 09:35:00 AM COMPARISON: None available. CLINICAL HISTORY: 28 year old female. Fall, pain. Patient tripped, twisted fall landing on right hip and hands. Generalized pain. FINDINGS: BONES AND JOINTS: No acute fracture. No focal osseous lesion. No joint dislocation. SOFT TISSUES: Difficult to exclude generalized soft tissue swelling in the distal forearm and at the wrist but might be body habitus related. Incidental right hand fourth finger ring artifact. IMPRESSION: 1. No acute fracture or dislocation. 2. Questionable generalized soft tissue swelling of the distal forearm and wrist. Electronically signed by: Helayne Hurst MD 02/29/2024  09:57 AM EDT RP Workstation: [id number]   DG Tibia/Fibula Right Result Date: 02/29/2024 EXAM: 3 VIEW(S) XRAY OF THE RIGHT TIBIA AND FIBULA 02/29/2024 09:35:00 AM COMPARISON: Right Ankle series 02/29/2024. CLINICAL HISTORY: 28 year old female with fall, pain, twisted fall, and angulation of the right ankle, splinted by [facility]. FINDINGS: BONES AND JOINTS: No acute fracture. No focal osseous lesion. No joint dislocation. SOFT TISSUES: Incidental calcified vascular phleboliths in the right lower extremity are chronic and benign. IMPRESSION: 1. No acute fracture or dislocation. Electronically signed by: Helayne Hurst MD 02/29/2024 09:56 AM EDT RP Workstation: [id number]   DG Ankle Complete Right Result Date: 02/29/2024 EXAM: 3 VIEW(S) XRAY OF THE RIGHT ANKLE 02/29/2024 09:35:00 AM CLINICAL HISTORY: 28 year old female. Fall with pain and angulation of the right ankle. [facility] splinted ankle for immobilization. COMPARISON: Right ankle series 12/15/2016. FINDINGS: BONES AND JOINTS: No acute fracture. No focal osseous lesion. No joint dislocation. No evidence of joint effusion. Crosstable lateral view is mildly oblique. SOFT TISSUES: Incidental chronic rounded calcified vascular phleboliths in the medial calf, stable. IMPRESSION: 1. No acute osseous abnormality. Electronically signed by: Helayne Hurst MD 02/29/2024 09:52 AM EDT RP Workstation: [id number]     Procedures   Medications Ordered in the ED  fentaNYL  (SUBLIMAZE ) injection 50 mcg (50 mcg Intramuscular Given 02/29/24 1026)  acetaminophen  (TYLENOL ) tablet 1,000 mg (1,000 mg Oral Given 02/29/24 1026)                                    Medical Decision Making This patient presents to the ED for concern of right ankle pain and right wrist pain, this involves an extensive number of treatment options, and is a complaint that carries with it a high risk of complications and morbidity.  The differential diagnosis includes EXTR, dislocation, sprain, strain   Co  morbidities that complicate the patient evaluation  Diabetes       Imaging Studies ordered:  I ordered imaging studies including x-ray right ankle I independently visualized and interpreted imaging which showed fracture or dislocation, x-ray right wrist shows no fracture or dislocation I agree with the radiologist interpretation     Problem List / ED Course / Critical interventions / Medication management  Patient here with right ankle pain and right wrist pain after fall, right ankle has some lateral swelling but Thompson test is normal, no gross laxity though exam is somewhat limited due to patient's pain.  She has good pulses.  X-rays are negative.  Patient refused crutches so was put in a cam boot and given right wrist brace, advised on analgesics for pain,  she is unfortunately allergic to ibuprofen, advised on RICE therapy and PCP/orthopedic follow-up and return precautions. I ordered medication including Tylenol  and IM fentanyl  for pain Reevaluation of the patient after these medicines showed that the patient improved I have reviewed the patients home medicines and have made adjustments as needed       Amount and/or Complexity of Data Reviewed External Data Reviewed: notes. Radiology: ordered.  Risk OTC drugs. Prescription drug management.        Final diagnoses:  None    ED Discharge Orders     None          Suellen Sherran DELENA DEVONNA 02/29/24 1403    Suzette Pac, MD 03/02/24 1103

## 2024-03-24 ENCOUNTER — Emergency Department (HOSPITAL_COMMUNITY)

## 2024-03-24 ENCOUNTER — Other Ambulatory Visit: Payer: Self-pay

## 2024-03-24 ENCOUNTER — Emergency Department (HOSPITAL_COMMUNITY)
Admission: EM | Admit: 2024-03-24 | Discharge: 2024-03-24 | Disposition: A | Discharge status: Home / Self Care | Attending: Emergency Medicine | Admitting: Emergency Medicine

## 2024-03-24 ENCOUNTER — Encounter (HOSPITAL_COMMUNITY): Payer: Self-pay

## 2024-03-24 DIAGNOSIS — S39012A Strain of muscle, fascia and tendon of lower back, initial encounter: Secondary | ICD-10-CM | POA: Diagnosis not present

## 2024-03-24 DIAGNOSIS — B9689 Other specified bacterial agents as the cause of diseases classified elsewhere: Secondary | ICD-10-CM | POA: Diagnosis not present

## 2024-03-24 DIAGNOSIS — Z794 Long term (current) use of insulin: Secondary | ICD-10-CM | POA: Diagnosis not present

## 2024-03-24 DIAGNOSIS — S3992XA Unspecified injury of lower back, initial encounter: Secondary | ICD-10-CM | POA: Diagnosis present

## 2024-03-24 DIAGNOSIS — R109 Unspecified abdominal pain: Secondary | ICD-10-CM | POA: Diagnosis not present

## 2024-03-24 DIAGNOSIS — N760 Acute vaginitis: Secondary | ICD-10-CM | POA: Insufficient documentation

## 2024-03-24 DIAGNOSIS — R102 Pelvic and perineal pain unspecified side: Secondary | ICD-10-CM

## 2024-03-24 LAB — URINALYSIS, ROUTINE W REFLEX MICROSCOPIC
Bacteria, UA: NONE SEEN
Bilirubin Urine: NEGATIVE
Glucose, UA: NEGATIVE mg/dL
Hgb urine dipstick: NEGATIVE
Ketones, ur: NEGATIVE mg/dL
Leukocytes,Ua: NEGATIVE
Nitrite: NEGATIVE
Protein, ur: 100 mg/dL — AB
Specific Gravity, Urine: 1.026 (ref 1.005–1.030)
pH: 5 (ref 5.0–8.0)

## 2024-03-24 LAB — WET PREP, GENITAL
Sperm: NONE SEEN
Trich, Wet Prep: NONE SEEN
Yeast Wet Prep HPF POC: NONE SEEN

## 2024-03-24 LAB — PREGNANCY, URINE: Preg Test, Ur: NEGATIVE

## 2024-03-24 LAB — HIV ANTIBODY (ROUTINE TESTING W REFLEX): HIV Screen 4th Generation wRfx: NONREACTIVE

## 2024-03-24 MED ORDER — METRONIDAZOLE 500 MG PO TABS: 500.0000 mg | ORAL_TABLET | Freq: Two times a day (BID) | ORAL | 0 refills | Status: AC
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
## 2024-03-25 LAB — RPR: RPR Ser Ql: NONREACTIVE

## 2024-03-27 LAB — GC/CHLAMYDIA PROBE AMP (~~LOC~~) NOT AT ARMC
Chlamydia: NEGATIVE
Comment: NEGATIVE
Comment: NORMAL
Neisseria Gonorrhea: NEGATIVE

## 2024-05-04 ENCOUNTER — Ambulatory Visit: Attending: Internal Medicine | Admitting: Internal Medicine

## 2024-05-04 ENCOUNTER — Encounter: Payer: Self-pay | Admitting: Internal Medicine

## 2024-05-04 NOTE — Progress Notes (Signed)
 Erroneous encounter - please disregard.

## 2024-06-19 ENCOUNTER — Other Ambulatory Visit: Payer: Self-pay | Admitting: Family Medicine

## 2024-06-19 DIAGNOSIS — R1030 Lower abdominal pain, unspecified: Secondary | ICD-10-CM

## 2024-06-22 ENCOUNTER — Ambulatory Visit (HOSPITAL_COMMUNITY)
Admission: RE | Admit: 2024-06-22 | Discharge: 2024-06-22 | Disposition: A | Discharge status: Home / Self Care | Source: Ambulatory Visit | Attending: Family Medicine | Admitting: Family Medicine

## 2024-06-22 DIAGNOSIS — R1030 Lower abdominal pain, unspecified: Secondary | ICD-10-CM | POA: Insufficient documentation

## 2024-07-10 ENCOUNTER — Ambulatory Visit: Admitting: Internal Medicine
